# Patient Record
Sex: Female | Born: 1940 | Race: White | Hispanic: No | Smoking: Never smoker
Health system: Southern US, Community
[De-identification: ages and names within clinical notes are randomized; demographics above are authoritative.]

## PROBLEM LIST (undated history)

## (undated) ENCOUNTER — Ambulatory Visit: Payer: No Typology Code available for payment source

## (undated) DIAGNOSIS — I10 Essential (primary) hypertension: Secondary | ICD-10-CM

## (undated) DIAGNOSIS — R42 Dizziness and giddiness: Secondary | ICD-10-CM

## (undated) DIAGNOSIS — F32A Depression, unspecified: Secondary | ICD-10-CM

## (undated) DIAGNOSIS — M1990 Unspecified osteoarthritis, unspecified site: Secondary | ICD-10-CM

## (undated) DIAGNOSIS — I4891 Unspecified atrial fibrillation: Secondary | ICD-10-CM

## (undated) HISTORY — PX: ABDOMINAL HYSTERECTOMY: SHX81

---

## 2008-05-19 ENCOUNTER — Ambulatory Visit: Payer: Self-pay

## 2011-10-13 ENCOUNTER — Ambulatory Visit: Payer: Self-pay | Admitting: Internal Medicine

## 2020-05-15 ENCOUNTER — Other Ambulatory Visit: Payer: Self-pay | Admitting: Family

## 2020-05-15 DIAGNOSIS — R590 Localized enlarged lymph nodes: Secondary | ICD-10-CM

## 2020-05-28 ENCOUNTER — Other Ambulatory Visit: Payer: Self-pay

## 2020-05-28 ENCOUNTER — Ambulatory Visit
Admission: RE | Admit: 2020-05-28 | Discharge: 2020-05-28 | Disposition: A | Discharge status: Home / Self Care | Payer: No Typology Code available for payment source | Source: Ambulatory Visit | Attending: Family | Admitting: Family

## 2020-05-28 DIAGNOSIS — R590 Localized enlarged lymph nodes: Secondary | ICD-10-CM | POA: Diagnosis not present

## 2020-05-28 LAB — POCT I-STAT CREATININE: Creatinine, Ser: 0.6 mg/dL (ref 0.44–1.00)

## 2020-05-28 IMAGING — CT CT NECK W/ CM
3 of 4 series · 14 of 33 positions shown, 17 images · IV contrast (omnipaque)
Comparison: None.

CLINICAL DATA: Knot and swelling inferior to right ear

EXAM:
CT NECK WITH CONTRAST
TECHNIQUE: Multidetector CT imaging of the neck was performed using the
standard protocol following the bolus administration of intravenous
contrast.
CONTRAST:  75mL OMNIPAQUE IOHEXOL 300 MG/ML  SOLN

[Series 5: sag neck · sagittal · 0.41mm/px · 5 of 103 slices shown, 6 images]
[im 35/103  bone]
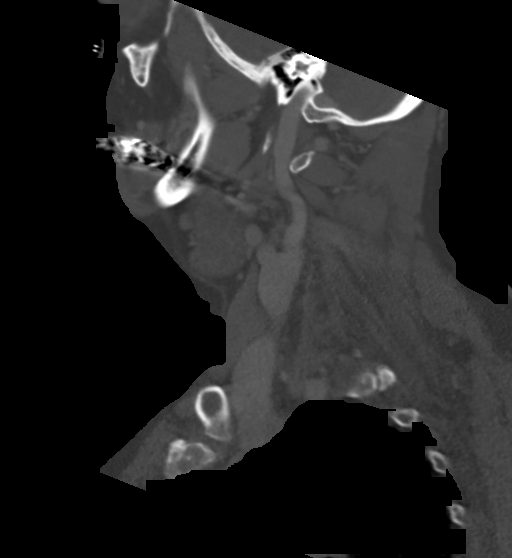
[im 43/103  bone]
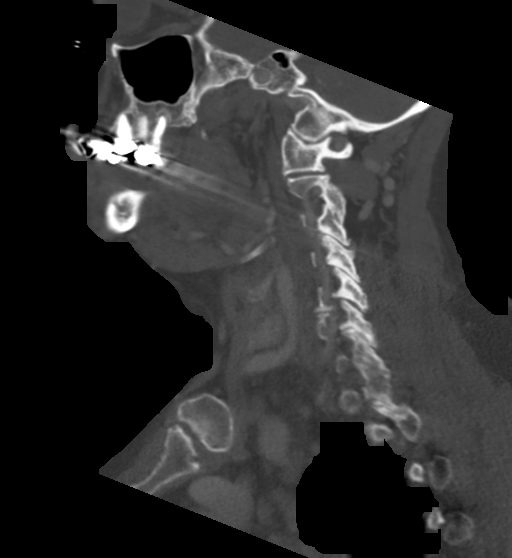
[im 52/103  soft-tissue]
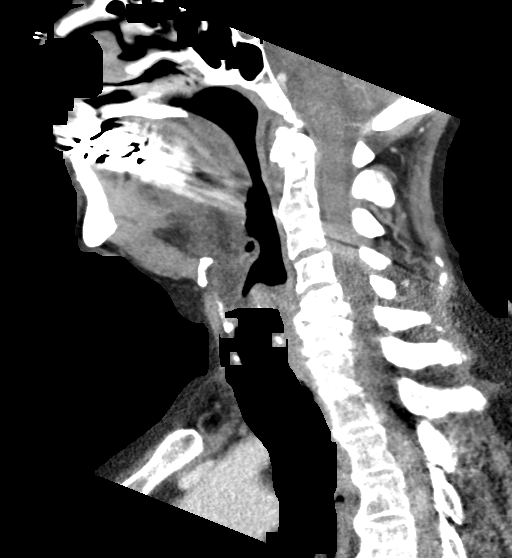
[im 52/103  bone]
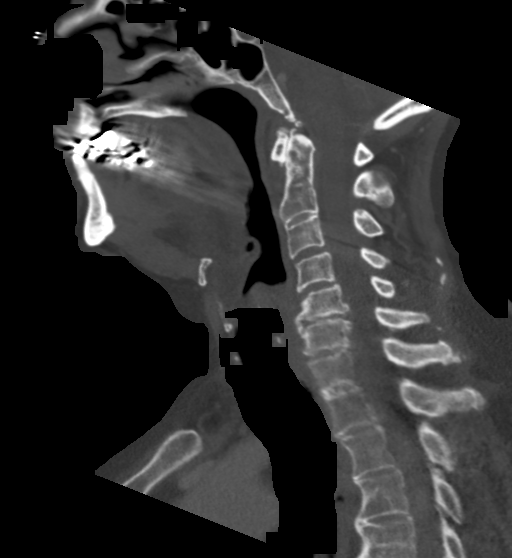
[im 60/103  bone]
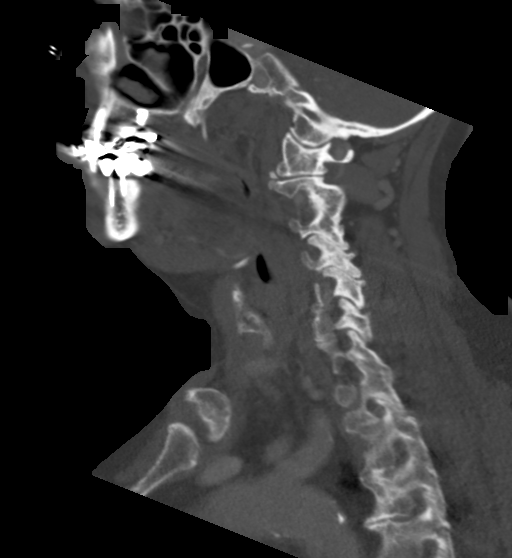
[im 69/103  bone]
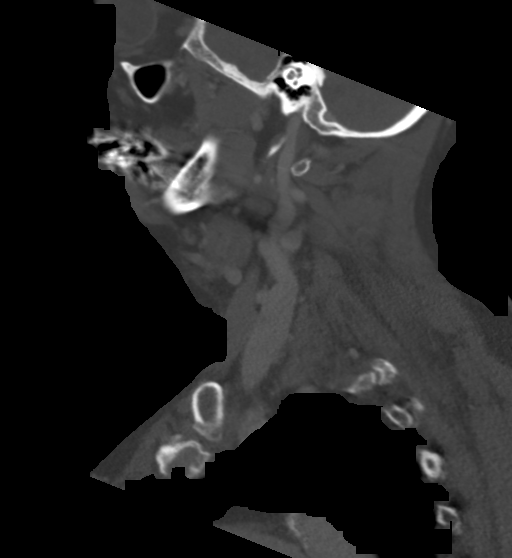

[Series 6: cor neck · coronal · 0.40mm/px · 3 of 106 slices shown]
[im 33/106  bone]
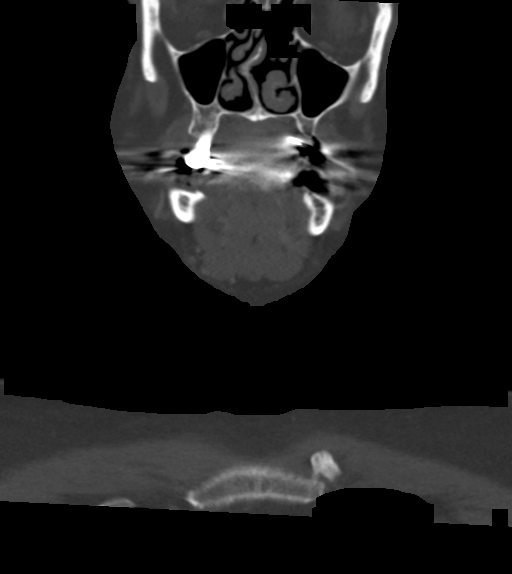
[im 46/106  bone]
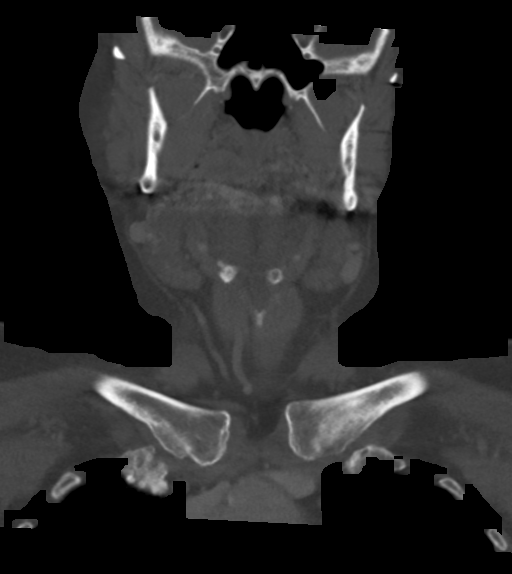
[im 60/106  bone]
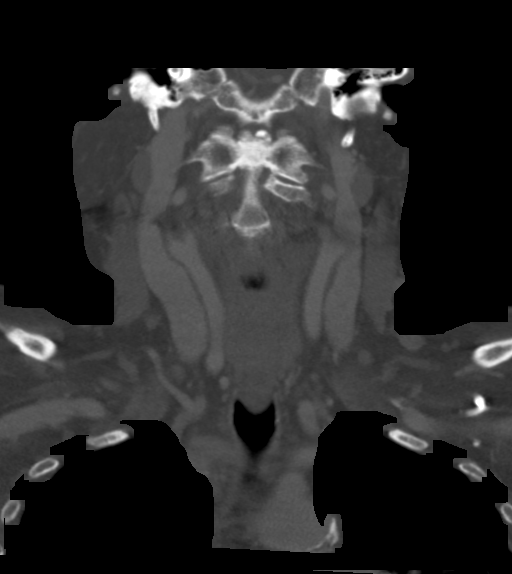

[Series 7: orthogonal ax · axial · 0.40mm/px · z∈[+1515,+1667]mm · 6 of 116 slices shown, 8 images]
[im 17/116  soft-tissue]
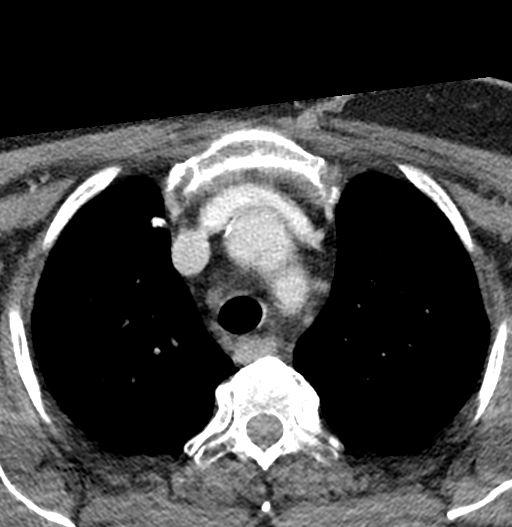
[im 17/116  bone]
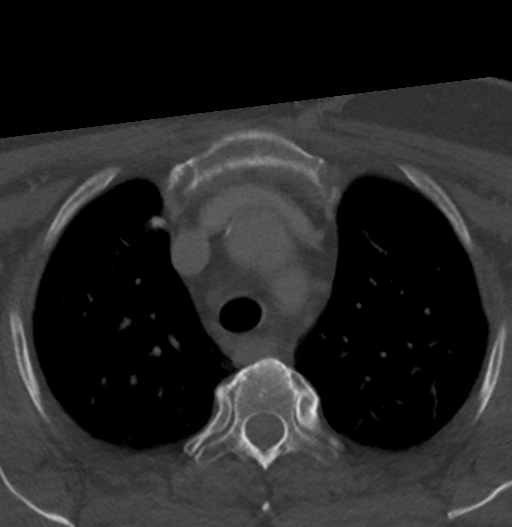
[im 33/116  bone]
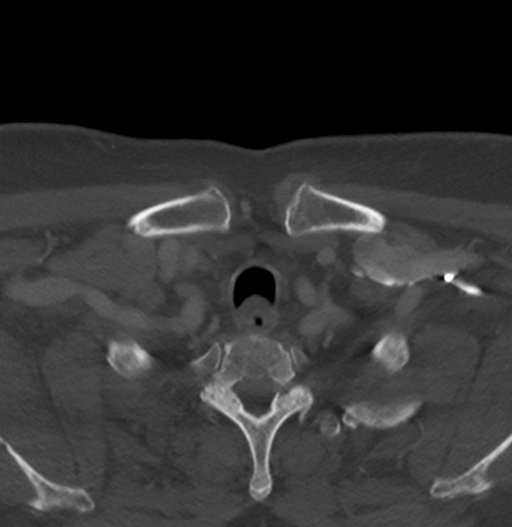
[im 50/116  bone]
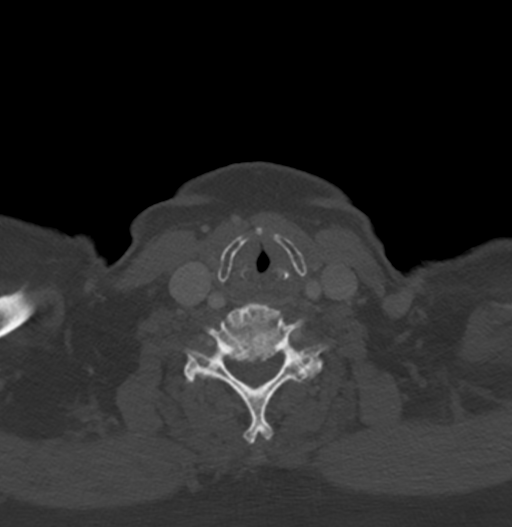
[im 66/116  bone]
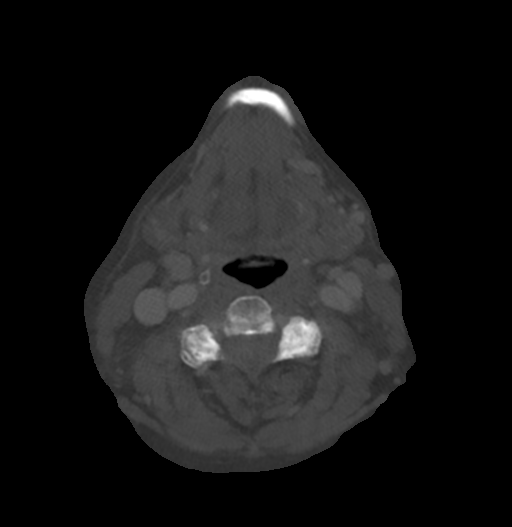
[im 83/116  soft-tissue]
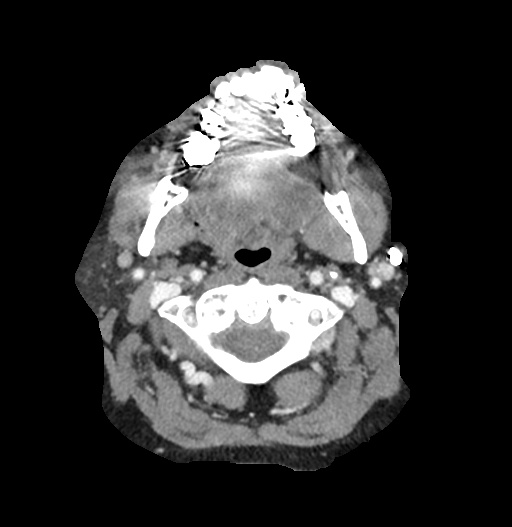
[im 83/116  bone]
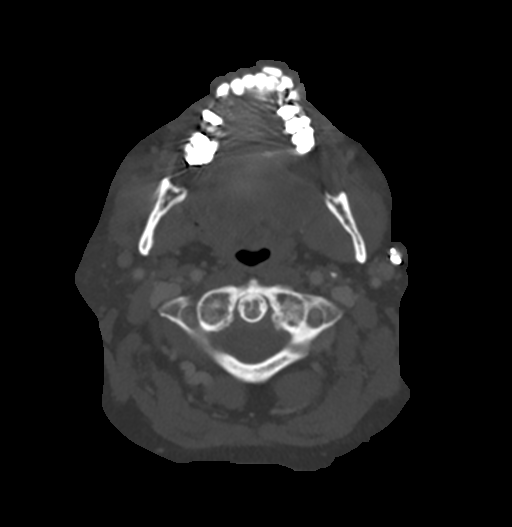
[im 99/116  bone]
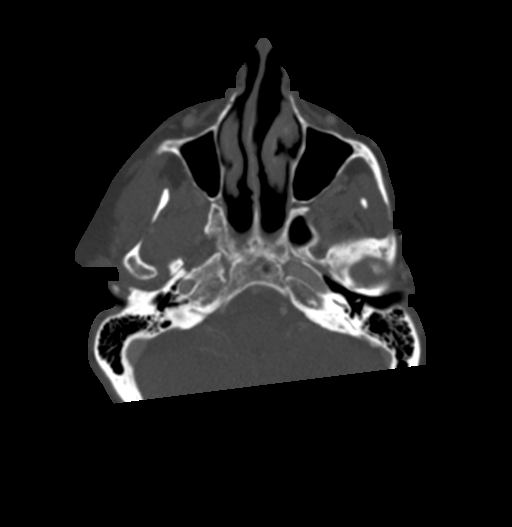

[14 of 33 positions shown; findings below may reference images not displayed]

FINDINGS: Pharynx and larynx: Normal. No mass or swelling.

Salivary glands: Left parotidectomy with surgical clips in the
region. No mass lesion.

Normal right parotid. Area of concern marked by vitamin-E capsule
lung the lower margin of the right parotid gland. No mass or
adenopathy in this region.

Submandibular gland normal bilaterally.

Thyroid: Negative.  Small thyroid volume.

Lymph nodes: No enlarged lymph nodes in the neck.

Vascular: Normal vascular enhancement.

Limited intracranial: Negative

Visualized orbits: Negative

Mastoids and visualized paranasal sinuses: Negative

Skeleton: Cervical spondylosis with multilevel disc and facet
degeneration. Spurring most prominent at C5-6 and C6-7.

Upper chest: Lung apices clear bilaterally.

Other: None
IMPRESSION: Negative for mass or adenopathy in neck

Postop left parotidectomy

Area of concern is located along the lower margin of the right
parotid gland. No mass in this region.

## 2020-05-28 MED ORDER — IOHEXOL 300 MG/ML  SOLN
75.0000 mL | Freq: Once | INTRAMUSCULAR | Status: AC | PRN
  Administered 2020-05-28: 75 mL via INTRAVENOUS

## 2021-05-07 ENCOUNTER — Other Ambulatory Visit: Payer: Self-pay

## 2021-05-07 ENCOUNTER — Ambulatory Visit
Admission: EM | Admit: 2021-05-07 | Discharge: 2021-05-07 | Disposition: A | Discharge status: Home / Self Care | Payer: No Typology Code available for payment source | Attending: Internal Medicine | Admitting: Internal Medicine

## 2021-05-07 DIAGNOSIS — N390 Urinary tract infection, site not specified: Secondary | ICD-10-CM | POA: Insufficient documentation

## 2021-05-07 LAB — URINALYSIS, ROUTINE W REFLEX MICROSCOPIC
Bilirubin Urine: NEGATIVE
Glucose, UA: NEGATIVE mg/dL
Ketones, ur: NEGATIVE mg/dL
Nitrite: POSITIVE — AB
Protein, ur: 30 mg/dL — AB
Specific Gravity, Urine: 1.025 (ref 1.005–1.030)
pH: 6 (ref 5.0–8.0)

## 2021-05-07 LAB — URINALYSIS, MICROSCOPIC (REFLEX): WBC, UA: >50 WBC/hpf (ref 0–5)

## 2021-05-07 MED ORDER — CEPHALEXIN 500 MG PO CAPS: 500.0000 mg | ORAL_CAPSULE | Freq: Two times a day (BID) | ORAL | 0 refills | Status: AC

## 2021-05-07 NOTE — ED Triage Notes (Signed)
Pt c/o possible UTI. Pt has had urinary pressure for "quite a while".

## 2021-05-07 NOTE — ED Provider Notes (Signed)
[facility]    CSN: [id number] Arrival date & time: 05/07/21  1126      History   Chief Complaint Chief Complaint  Patient presents with   Urinary Tract Infection    HPI Erica Edwards is a 81 y.o. female.  She presents today "a UTI," with urinary urgency and pressure, increased frequency frequency, including at nighttime.  She says has been going on for "a while", hard to pin down exactly how long.    No dysuria, no abdominal or pelvic pain.  No itching.  No fever.  No unusual vaginal discharge or bleeding, no change in bowel habits.  No nausea or vomiting.  Says she gets a urinary tract infection may be 2-3 times per year.  No change in chronic back pain which she has had for years.  HPI  History reviewed. No pertinent past medical history.  There are no problems to display for this patient.   History reviewed. No pertinent surgical history.   Home Medications    Prior to Admission medications   Medication Sig Start Date End Date Taking? Authorizing Provider  amLODipine (NORVASC) 10 MG tablet TAKE ONE-HALF TABLET BY MOUTH EVERY DAY FOR BLOOD PRESSURE 04/02/21  Yes [provider]  apixaban (ELIQUIS) 5 MG TABS tablet TAKE ONE TABLET BY MOUTH EVERY 12 HOURS - CAUTION BLOOD THINNER 05/31/20  Yes [provider]  cephALEXin (KEFLEX) 500 MG capsule Take 1 capsule (500 mg total) by mouth 2 (two) times daily for 5 days. 05/07/21 05/12/21 Yes Isa Rankin, MD  diclofenac Sodium (VOLTAREN) 1 % GEL APPLY 4 GRAMS TOPICALLY TWO TIMES A DAY AS NEEDED PUT ON KNEES FOR PAIN AS DIRECTED. USE GLOVES TO APPLY. 04/02/21  Yes [provider]  gabapentin (NEURONTIN) 300 MG capsule TAKE TWO CAPSULES BY MOUTH THREE TIMES A DAY FOR NERVE PAIN *MAY CAUSE DROWSINESS* 04/02/21  Yes [provider]  levothyroxine (SYNTHROID) 100 MCG tablet Take by mouth.   Yes [provider]  lisinopril (ZESTRIL) 40 MG tablet TAKE ONE-HALF TABLET BY MOUTH  EVERY DAY FOR BLOOD PRESSURE 04/02/21  Yes [provider]  trospium (SANCTURA) 20 MG tablet Take by mouth.   Yes [provider]    Family History History reviewed. No pertinent family history.  Social History Social History   Tobacco Use   Smoking status: Never   Smokeless tobacco: Never  Vaping Use   Vaping Use: Never used  Substance Use Topics   Alcohol use: Never   Drug use: Never     Allergies   Azithromycin, Gabapentin, Naproxen, Nortriptyline, Sertraline, Venlafaxine, Penicillin g, and Streptomycin   Review of Systems Review of Systems see HPI   Physical Exam Triage Vital Signs ED Triage Vitals  Enc Vitals Group     BP 05/07/21 1400 140/72     Pulse Rate 05/07/21 1400 85     Resp 05/07/21 1400 18     Temp 05/07/21 1400 97.9 F (36.6 C)     Temp Source 05/07/21 1400 Oral     SpO2 05/07/21 1400 98 %     Weight 05/07/21 1357 173 lb (78.5 kg)     Height 05/07/21 1357 4\' 11"  (1.499 m)     Pain Score 05/07/21 1356 0     Pain Loc --    Updated Vital Signs BP 140/72 (BP Location: Left Arm)    Pulse 85    Temp 97.9 F (36.6 C) (Oral)    Resp 18  Ht 4\' 11"  (1.499 m)    Wt 78.5 kg    LMP  (LMP Unknown)    SpO2 98%    BMI 34.94 kg/m   Physical Exam Constitutional:      General: She is not in acute distress.    Appearance: She is not ill-appearing or toxic-appearing.     Comments: Good hygiene Somewhat hard of hearing  HENT:     Head: Atraumatic.     Mouth/Throat:     Mouth: Mucous membranes are moist.  Eyes:     Conjunctiva/sclera:     Right eye: Right conjunctiva is not injected. No exudate.    Left eye: Left conjunctiva is not injected. No exudate.    Comments: Conjugate gaze observed  Cardiovascular:     Rate and Rhythm: Normal rate.  Pulmonary:     Effort: Pulmonary effort is normal. No respiratory distress.  Abdominal:     General: There is no distension.  Musculoskeletal:     Cervical back: Neck supple.     Comments: Walked  into the urgent care independently, does have a walking stick  Skin:    General: Skin is warm and dry.     Comments: Pink, no cyanosis  Neurological:     Mental Status: She is alert.     Comments: Face is symmetric, speech is clear, coherent, logical     UC Treatments / Results  Labs (all labs ordered are listed, but only abnormal results are displayed) Labs Reviewed  URINALYSIS, ROUTINE W REFLEX MICROSCOPIC - Abnormal; Notable for the following components:      Result Value   APPearance CLOUDY (*)    Hgb urine dipstick TRACE (*)    Protein, ur 30 (*)    Nitrite POSITIVE (*)    Leukocytes,Ua SMALL (*)    All other components within normal limits  URINALYSIS, MICROSCOPIC (REFLEX) - Abnormal; Notable for the following components:   Bacteria, UA MANY (*)    All other components within normal limits  URINE CULTURE  UA notable for >50 WBC, 6-10 RBC, many bacteria, 0-5 squames Culture pending  EKG N/A  Radiology No results found. N/A  Procedures Procedures (including critical care time) N/A  Medications Ordered in UC Medications - No data to display N/A   Final Clinical Impressions(s) / UC Diagnoses   Final diagnoses:  Lower urinary tract infectious disease     Discharge Instructions      Symptoms and urinalysis today are consistent with a urinary tract infection.  Prescription for cephalexin (antibiotic) sent to the pharmacy.  A urine culture is pending; the urgent care will contact you if a change in treatment is needed.  Push fluids.  Recheck for new fever >100.5, persistent vomiting, severe/persistent abdominal or pelvic pain, or if symptoms are not starting to improve in a few days as expected.     ED Prescriptions     Medication Sig Dispense Auth. Provider   cephALEXin (KEFLEX) 500 MG capsule Take 1 capsule (500 mg total) by mouth 2 (two) times daily for 5 days. 10 capsule Wynona Luna, MD      PDMP not reviewed this encounter.   Wynona Luna, MD 05/08/21 [phone]

## 2021-05-07 NOTE — Discharge Instructions (Signed)
Symptoms and urinalysis today are consistent with a urinary tract infection.  Prescription for cephalexin (antibiotic) sent to the pharmacy.  A urine culture is pending; the urgent care will contact you if a change in treatment is needed.  Push fluids.  Recheck for new fever >100.5, persistent vomiting, severe/persistent abdominal or pelvic pain, or if symptoms are not starting to improve in a few days as expected.

## 2021-05-10 LAB — URINE CULTURE: Culture: >=100000 — AB

## 2021-05-13 ENCOUNTER — Other Ambulatory Visit: Payer: Self-pay

## 2021-05-13 ENCOUNTER — Ambulatory Visit
Admission: EM | Admit: 2021-05-13 | Discharge: 2021-05-13 | Disposition: A | Discharge status: Home / Self Care | Payer: No Typology Code available for payment source | Attending: Internal Medicine | Admitting: Internal Medicine

## 2021-05-13 ENCOUNTER — Telehealth: Payer: Self-pay | Admitting: Internal Medicine

## 2021-05-13 DIAGNOSIS — E079 Disorder of thyroid, unspecified: Secondary | ICD-10-CM

## 2021-05-13 DIAGNOSIS — Z79899 Other long term (current) drug therapy: Secondary | ICD-10-CM | POA: Diagnosis not present

## 2021-05-13 DIAGNOSIS — Z719 Counseling, unspecified: Secondary | ICD-10-CM | POA: Diagnosis not present

## 2021-05-13 HISTORY — DX: Unspecified osteoarthritis, unspecified site: M19.90

## 2021-05-13 HISTORY — DX: Depression, unspecified: F32.A

## 2021-05-13 HISTORY — DX: Dizziness and giddiness: R42

## 2021-05-13 HISTORY — DX: Unspecified atrial fibrillation: I48.91

## 2021-05-13 LAB — TSH: TSH: 10.911 u[IU]/mL — ABNORMAL HIGH (ref 0.350–4.500)

## 2021-05-13 MED ORDER — LEVOTHYROXINE SODIUM 112 MCG PO TABS: 112.0000 ug | ORAL_TABLET | Freq: Every day | ORAL | 0 refills | Status: DC

## 2021-05-13 NOTE — ED Triage Notes (Signed)
Patient is here for "concern with medication due to article in newspaper" (Synthroid). No other concerns.

## 2021-05-13 NOTE — Telephone Encounter (Signed)
Medication has been sent to the pharmacy on file

## 2021-05-13 NOTE — ED Provider Notes (Signed)
[facility]    CSN: [id number] Arrival date & time: 05/13/21  0849      History   Chief Complaint Chief Complaint  Patient presents with   Medication Problem    HPI Erica Edwards is a 81 y.o. female comes to the urgent care with questions about her medication.  Patient is currently on Synthroid and she read in newspaper that Tirosint was being recalled.  Patient came here to verify that her prescription is not part of the medications being recalled.  She has taken Synthroid for several years.  No cold intolerance, weight gain, constipation or fatigue.  HPI  Past Medical History:  Diagnosis Date   Atrial fibrillation (HCC)    Depression    Osteoarthritis    Vertigo     There are no problems to display for this patient.   History reviewed. No pertinent surgical history.  OB History   No obstetric history on file.      Home Medications    Prior to Admission medications   Medication Sig Start Date End Date Taking? Authorizing Provider  amLODipine (NORVASC) 10 MG tablet TAKE ONE-HALF TABLET BY MOUTH EVERY DAY FOR BLOOD PRESSURE 04/02/21  Yes [provider]  apixaban (ELIQUIS) 5 MG TABS tablet TAKE ONE TABLET BY MOUTH EVERY 12 HOURS - CAUTION BLOOD THINNER 05/31/20  Yes [provider]  diclofenac Sodium (VOLTAREN) 1 % GEL APPLY 4 GRAMS TOPICALLY TWO TIMES A DAY AS NEEDED PUT ON KNEES FOR PAIN AS DIRECTED. USE GLOVES TO APPLY. 04/02/21  Yes [provider]  gabapentin (NEURONTIN) 300 MG capsule TAKE TWO CAPSULES BY MOUTH THREE TIMES A DAY FOR NERVE PAIN *MAY CAUSE DROWSINESS* 04/02/21  Yes [provider]  levothyroxine (SYNTHROID) 100 MCG tablet Take by mouth.   Yes [provider]  lisinopril (ZESTRIL) 40 MG tablet TAKE ONE-HALF TABLET BY MOUTH EVERY DAY FOR BLOOD PRESSURE 04/02/21  Yes [provider]  trospium (SANCTURA) 20 MG tablet Take by mouth.   Yes [provider]    Family History No  family history on file.  Social History Social History   Tobacco Use   Smoking status: Never   Smokeless tobacco: Never  Vaping Use   Vaping Use: Never used  Substance Use Topics   Alcohol use: Never   Drug use: Never     Allergies   Azithromycin, Gabapentin, Naproxen, Nortriptyline, Sertraline, Venlafaxine, Penicillin g, and Streptomycin   Review of Systems Review of Systems  All other systems reviewed and are negative.   Physical Exam Triage Vital Signs ED Triage Vitals [05/13/21 0940]  Enc Vitals Group     BP (!) 156/78     Pulse Rate 87     Resp 20     Temp 97.8 F (36.6 C)     Temp Source Oral     SpO2 97 %     Weight 173 lb 1 oz (78.5 kg)     Height      Head Circumference      Peak Flow      Pain Score 0     Pain Loc      Pain Edu?      Excl. in [place]?    No data found.  Updated Vital Signs BP (!) 156/78 (BP Location: Left Arm)    Pulse 87    Temp 97.8 F (36.6 C) (Oral)    Resp 20    Wt 78.5 kg    LMP  (  LMP Unknown)    SpO2 97%    BMI 34.95 kg/m   Visual Acuity Right Eye Distance:   Left Eye Distance:   Bilateral Distance:    Right Eye Near:   Left Eye Near:    Bilateral Near:     Physical Exam Vitals and nursing note reviewed.  Constitutional:      Appearance: Normal appearance.  Cardiovascular:     Rate and Rhythm: Normal rate and regular rhythm.     Pulses: Normal pulses.     Heart sounds: Normal heart sounds.  Pulmonary:     Effort: Pulmonary effort is normal.     Breath sounds: Normal breath sounds.  Musculoskeletal:        General: Normal range of motion.  Neurological:     Mental Status: She is alert.     UC Treatments / Results  Labs (all labs ordered are listed, but only abnormal results are displayed) Labs Reviewed  TSH    EKG   Radiology No results found.  Procedures Procedures (including critical care time)  Medications Ordered in UC Medications - No data to display  Initial Impression / Assessment and  Plan / UC Course  I have reviewed the triage vital signs and the nursing notes.  Pertinent labs & imaging results that were available during my care of the patient were reviewed by me and considered in my medical decision making (see chart for details).     1.  Medication therapy education: TSH Patient was reassured that her medication is not Tirosint. If labs are abnormal we will call patient with recommendations. Final Clinical Impressions(s) / UC Diagnoses   Final diagnoses:  Medication therapy continued     Discharge Instructions      Medication is not the brand that is being recalled. Continue taking the medication We will call you with recommendation if labs are abnormal   ED Prescriptions   None    PDMP not reviewed this encounter.   Merrilee Jansky, MD 05/14/21 1640

## 2021-05-13 NOTE — Discharge Instructions (Signed)
Medication is not the brand that is being recalled. Continue taking the medication We will call you with recommendation if labs are abnormal

## 2021-05-13 NOTE — ED Triage Notes (Signed)
See problem list for Full Medical History.

## 2021-07-16 ENCOUNTER — Ambulatory Visit
Admission: EM | Admit: 2021-07-16 | Discharge: 2021-07-16 | Disposition: A | Discharge status: Home / Self Care | Payer: No Typology Code available for payment source | Attending: Physician Assistant | Admitting: Physician Assistant

## 2021-07-16 DIAGNOSIS — M5136 Other intervertebral disc degeneration, lumbar region: Secondary | ICD-10-CM | POA: Diagnosis not present

## 2021-07-16 DIAGNOSIS — M5442 Lumbago with sciatica, left side: Secondary | ICD-10-CM

## 2021-07-16 DIAGNOSIS — G8929 Other chronic pain: Secondary | ICD-10-CM | POA: Diagnosis not present

## 2021-07-16 DIAGNOSIS — M1612 Unilateral primary osteoarthritis, left hip: Secondary | ICD-10-CM | POA: Diagnosis not present

## 2021-07-16 MED ORDER — TRAMADOL HCL 50 MG PO TABS
50.0000 mg | ORAL_TABLET | Freq: Three times a day (TID) | ORAL | 0 refills | Status: AC | PRN
Start: 2021-07-16 — End: 2021-07-21

## 2021-07-16 NOTE — ED Provider Notes (Signed)
?[facility] ? ? ? ?CSN: [id number] ?Arrival date & time: 07/16/21  [id number] ? ? ?  ? ?History   ?Chief Complaint ?Chief Complaint  ?Patient presents with  ? Hip Pain  ? Back Pain  ? Labs Only  ? ? ?HPI ?Erica Edwards is a 81 y.o. female with history of atrial fibrillation (anticoagulated on Eliquis), anxiety, BPPV, chronic back pain, degenerative disc disease of lumbar spine, depression, hypertension, hyperlipidemia, diastolic heart dysfunction, hypothyroidism, GERD, IBS and osteoarthritis of knees and hips. ? ?Patient presents today for evaluation of chronic back and hip pain.  Patient says she has had pain for several years.  Patient is presently on gabapentin 600 mg 3 times daily for this.  She follows up with the [facility].  Patient also sees a Restaurant manager, fast food.  Patient has MRI report from 2018 which says she has degenerative disc disease, facet arthropathy.  Patient reports her chiropractor told her her tailbone was "too far backwards" and she had slipped disks.  She had x-ray performed recently which indicated only mild to moderate degenerative disc disease, spurring, and arthropathy.  Patient says her back and hip pain have been worse recently.  She feels radiation of the pain from the low back down the left hip all the way to her knee.  Pain is worse with sitting for long time or changing positions.  Increased pain when she lifts her leg.  No loss of bowel or bladder control or leg weakness or falls recently. ? ?She saw PCP last month and had lab work ordered.  She says she does not want to go "all the way out there" to have the labs done and asks to have them done here. ? ?HPI ? ?Past Medical History:  ?Diagnosis Date  ? Atrial fibrillation ([facility])   ? Depression   ? Osteoarthritis   ? Vertigo   ? ? ?There are no problems to display for this patient. ? ? ?History reviewed. No pertinent surgical history. ? ?OB History   ?No obstetric history on file. ?  ? ? ? ?Home Medications   ? ?Prior to  Admission medications   ?Medication Sig Start Date End Date Taking? Authorizing Provider  ?traMADol (ULTRAM) 50 MG tablet Take 1 tablet (50 mg total) by mouth every 8 (eight) hours as needed for up to 5 days. 07/16/21 07/21/21 Yes Laurene Footman B, PA-C  ?amLODipine (NORVASC) 10 MG tablet TAKE ONE-HALF TABLET BY MOUTH EVERY DAY FOR BLOOD PRESSURE 04/02/21   [provider]  ?apixaban (ELIQUIS) 5 MG TABS tablet TAKE ONE TABLET BY MOUTH EVERY 12 HOURS - CAUTION BLOOD THINNER 05/31/20   [provider]  ?diclofenac Sodium (VOLTAREN) 1 % GEL APPLY 4 GRAMS TOPICALLY TWO TIMES A DAY AS NEEDED PUT ON KNEES FOR PAIN AS DIRECTED. USE GLOVES TO APPLY. 04/02/21   [provider]  ?gabapentin (NEURONTIN) 300 MG capsule TAKE TWO CAPSULES BY MOUTH THREE TIMES A DAY FOR NERVE PAIN *MAY CAUSE DROWSINESS* 04/02/21   [provider]  ?levothyroxine (SYNTHROID) 112 MCG tablet Take 1 tablet (112 mcg total) by mouth daily before breakfast. 05/13/21 06/12/21  Lamptey, Myrene Galas, MD  ?lisinopril (ZESTRIL) 40 MG tablet TAKE ONE-HALF TABLET BY MOUTH EVERY DAY FOR BLOOD PRESSURE 04/02/21   [provider]  ?trospium (SANCTURA) 20 MG tablet Take by mouth.    [provider]  ? ? ?Family History ?History reviewed. No pertinent family history. ? ?Social History ?Social History  ? ?Tobacco Use  ?  Smoking status: Never  ? Smokeless tobacco: Never  ?Vaping Use  ? Vaping Use: Never used  ?Substance Use Topics  ? Alcohol use: Never  ? Drug use: Never  ? ? ? ?Allergies   ?Azithromycin, Gabapentin, Naproxen, Nortriptyline, Sertraline, Venlafaxine, Penicillin g, and Streptomycin ? ? ?Review of Systems ?Review of Systems  ?Genitourinary:  Negative for dysuria, flank pain and frequency.  ?Musculoskeletal:  Positive for arthralgias, back pain and gait problem (uses cane). Negative for joint swelling.  ?Neurological:  Negative for weakness and numbness.  ? ? ?Physical Exam ?Triage Vital Signs ?ED Triage Vitals   ?Enc Vitals Group  ?   BP 07/16/21 0925 125/61  ?   Pulse Rate 07/16/21 0925 71  ?   Resp 07/16/21 0925 16  ?   Temp 07/16/21 0925 98.3 ?F (36.8 ?C)  ?   Temp Source 07/16/21 0925 Oral  ?   SpO2 07/16/21 0925 95 %  ?   Weight --   ?   Height --   ?   Head Circumference --   ?   Peak Flow --   ?   Pain Score 07/16/21 0922 7  ?   Pain Loc --   ?   Pain Edu? --   ?   Excl. in [place]? --   ? ?No data found. ? ?Updated Vital Signs ?BP 125/61 (BP Location: Left Arm)   Pulse 71   Temp 98.3 ?F (36.8 ?C) (Oral)   Resp 16   LMP  (LMP Unknown)   SpO2 95%  ?   ? ?Physical Exam ?Vitals and nursing note reviewed.  ?Constitutional:   ?   General: She is not in acute distress. ?   Appearance: Normal appearance. She is not ill-appearing or toxic-appearing.  ?HENT:  ?   Head: Normocephalic and atraumatic.  ?Eyes:  ?   General: No scleral icterus.    ?   Right eye: No discharge.     ?   Left eye: No discharge.  ?   Conjunctiva/sclera: Conjunctivae normal.  ?Cardiovascular:  ?   Rate and Rhythm: Normal rate and regular rhythm.  ?   Heart sounds: Normal heart sounds.  ?Pulmonary:  ?   Effort: Pulmonary effort is normal. No respiratory distress.  ?   Breath sounds: Normal breath sounds.  ?Musculoskeletal:  ?   Cervical back: Neck supple.  ?   Lumbar back: Tenderness (Left lumbar paravertebral muscles. L4-L5, L5-S1) present. Decreased range of motion. Positive left straight leg raise test. Negative right straight leg raise test.  ?Skin: ?   General: Skin is dry.  ?Neurological:  ?   General: No focal deficit present.  ?   Mental Status: She is alert. Mental status is at baseline.  ?   Motor: No weakness.  ?   Coordination: Coordination normal.  ?   Gait: Gait abnormal (uses cane).  ?Psychiatric:     ?   Mood and Affect: Mood normal.     ?   Behavior: Behavior normal.     ?   Thought Content: Thought content normal.  ? ? ? ?UC Treatments / Results  ?Labs ?(all labs ordered are listed, but only abnormal results are displayed) ?Labs  Reviewed - No data to display ? ?EKG ? ? ?Radiology ?No results found. ? ?Procedures ?Procedures (including critical care time) ? ?Medications Ordered in UC ?Medications - No data to display ? ?Initial Impression / Assessment and Plan / UC Course  ?I have reviewed  the triage vital signs and the nursing notes. ? ?Pertinent labs & imaging results that were available during my care of the patient were reviewed by me and considered in my medical decision making (see chart for details). ? ?81 year old female presenting for chronic back and hip pain which have worsened over the past several days.  Pain radiates from the left low back to the left knee.  Patient is presently taking gabapentin 600 mg 3 times daily but reports she has been increasing it sometimes to 900 mg 2-3 times a day.  She says when she does this it makes her a little dizzy.  She says she has been desperate because her pain has been worse.  She is taking Eliquis and so avoids NSAIDs.  Patient also believes she cannot take Tylenol due to the Eliquis.  She has not tried that.  I did review patient's imaging from 2018 and recent x-rays.  She does have degenerative disc disease and facet arthropathy as well as suspected pinched nerve.  Patient sees chiropractor and PCP.  Exam today is consistent with low low back pain and sciatica.  No red flag signs or symptoms.  Discussed with patient taking the gabapentin as prescribed.  Advised trying over-the-counter Tylenol which should be fine with her Eliquis.  She just needs to avoid NSAIDs.  Prescribed a few Ultram if absolutely needed after reviewing controlled substance database advised her it is a low-dose narcotic so she may not be prescribed this again and could be referred to pain clinic but she should talk to her PCP about that.  Also advised asking PCP for referral to orthopedics where she can discussed eventually getting corticosteroid injections into the L-spine and/or hip joint.  Also discussed  importance of physical therapy which can help her improve her mobility.  Advised patient we do not perform routine lab work I see the labs were already ordered the [place] center.  Advised her to reach out to PCP and have the lab wo

## 2021-07-16 NOTE — ED Triage Notes (Signed)
Patient presents to Urgent Care with complaints of chronic back pain, hip pain 5-6 years. Treating pain with gabapentin.  Requesting blood work.  ?

## 2021-07-16 NOTE — Discharge Instructions (Signed)
-  Continue gabapentin 600 mg 3 times a day as instructed by your provider.  Do not increase medication without speaking to them first. ?- Start taking Tylenol up to 1 g or 1000 mg of Tylenol 3 times a day.  If that is not strong enough, try the tramadol.  This is a mild narcotic so I would expect you would not be prescribed this medication long-term unless you were to join a pain clinic.  You can speak to your provider about this if nothing else is helping ?- I advise you ask for referral to orthopedics.  You may benefit from having corticosteroid injections into your back or hip. ?- Also discussed with your provider about physical therapy. ?- Your provider has ordered the labs for you to have done at the [place].  If it is difficult for you to get out there, you can asked to have the lab sent to [place] or someone else closer to you. ?

## 2021-09-25 ENCOUNTER — Encounter: Payer: Self-pay | Admitting: Emergency Medicine

## 2021-09-25 ENCOUNTER — Other Ambulatory Visit: Payer: Self-pay

## 2021-09-25 ENCOUNTER — Ambulatory Visit
Admission: EM | Admit: 2021-09-25 | Discharge: 2021-09-25 | Disposition: A | Discharge status: Home / Self Care | Payer: No Typology Code available for payment source | Attending: Emergency Medicine | Admitting: Emergency Medicine

## 2021-09-25 DIAGNOSIS — K047 Periapical abscess without sinus: Secondary | ICD-10-CM | POA: Diagnosis not present

## 2021-09-25 MED ORDER — DOXYCYCLINE HYCLATE 100 MG PO CAPS: 100.0000 mg | ORAL_CAPSULE | Freq: Two times a day (BID) | ORAL | 0 refills | Status: DC

## 2021-09-25 NOTE — ED Provider Notes (Signed)
[facility]    CSN: [id number] Arrival date & time: 09/25/21  0802      History   Chief Complaint Chief Complaint  Patient presents with   Dental Pain    HPI Erica Edwards is a 81 y.o. female.   HPI  81 year old female here for evaluation of dental complaint.  Patient reports that she has been experiencing pain and swelling to the right side of her face and in her upper rear molar for the last 5 days.  She states that on the first day she had a subjective fever that last about 5 hours but she did not take her temperature.  She denies any drainage from the tooth or fever.  She has not contacted her dentist though she does have 1.  Past Medical History:  Diagnosis Date   Atrial fibrillation (HCC)    Depression    Osteoarthritis    Vertigo     There are no problems to display for this patient.   History reviewed. No pertinent surgical history.  OB History   No obstetric history on file.      Home Medications    Prior to Admission medications   Medication Sig Start Date End Date Taking? Authorizing Provider  amLODipine (NORVASC) 10 MG tablet TAKE ONE-HALF TABLET BY MOUTH EVERY DAY FOR BLOOD PRESSURE 04/02/21  Yes [provider]  apixaban (ELIQUIS) 5 MG TABS tablet TAKE ONE TABLET BY MOUTH EVERY 12 HOURS - CAUTION BLOOD THINNER 05/31/20  Yes [provider]  doxycycline (VIBRAMYCIN) 100 MG capsule Take 1 capsule (100 mg total) by mouth 2 (two) times daily. 09/25/21  Yes Becky Augusta, NP  gabapentin (NEURONTIN) 300 MG capsule TAKE TWO CAPSULES BY MOUTH THREE TIMES A DAY FOR NERVE PAIN *MAY CAUSE DROWSINESS* 04/02/21  Yes [provider]  levothyroxine (SYNTHROID) 112 MCG tablet Take 1 tablet (112 mcg total) by mouth daily before breakfast. 05/13/21 09/25/21 Yes Lamptey, Britta Mccreedy, MD  lisinopril (ZESTRIL) 40 MG tablet TAKE ONE-HALF TABLET BY MOUTH EVERY DAY FOR BLOOD PRESSURE 04/02/21  Yes [provider]  trospium (SANCTURA) 20  MG tablet Take by mouth.   Yes [provider]  diclofenac Sodium (VOLTAREN) 1 % GEL APPLY 4 GRAMS TOPICALLY TWO TIMES A DAY AS NEEDED PUT ON KNEES FOR PAIN AS DIRECTED. USE GLOVES TO APPLY. 04/02/21   [provider]    Family History History reviewed. No pertinent family history.  Social History Social History   Tobacco Use   Smoking status: Never   Smokeless tobacco: Never  Vaping Use   Vaping Use: Never used  Substance Use Topics   Alcohol use: Never   Drug use: Never     Allergies   Azithromycin, Gabapentin, Naproxen, Nortriptyline, Sertraline, Venlafaxine, Penicillin g, and Streptomycin   Review of Systems Review of Systems  Constitutional:  Negative for fever.  HENT:  Positive for dental problem and facial swelling. Negative for trouble swallowing.      Physical Exam Triage Vital Signs ED Triage Vitals  Enc Vitals Group     BP --      Pulse --      Resp --      Temp --      Temp src --      SpO2 --      Weight 09/25/21 0814 173 lb 1 oz (78.5 kg)     Height 09/25/21 0814 4\' 11"  (1.499 m)     Head Circumference --  Peak Flow --      Pain Score 09/25/21 0813 0     Pain Loc --      Pain Edu? --      Excl. in GC? --    No data found.  Updated Vital Signs BP 112/67 (BP Location: Right Arm)   Pulse 72   Temp 98.6 F (37 C) (Oral)   Resp 16   Ht 4\' 11"  (1.499 m)   Wt 173 lb 1 oz (78.5 kg)   LMP  (LMP Unknown)   SpO2 96%   BMI 34.95 kg/m   Visual Acuity Right Eye Distance:   Left Eye Distance:   Bilateral Distance:    Right Eye Near:   Left Eye Near:    Bilateral Near:     Physical Exam Vitals and nursing note reviewed.  Constitutional:      Appearance: Normal appearance. She is not ill-appearing.  HENT:     Head: Normocephalic and atraumatic.     Mouth/Throat:     Mouth: Mucous membranes are moist.     Pharynx: Oropharynx is clear. No oropharyngeal exudate or posterior oropharyngeal erythema.  Musculoskeletal:      Cervical back: Normal range of motion and neck supple.  Lymphadenopathy:     Cervical: No cervical adenopathy.  Skin:    General: Skin is warm and dry.     Capillary Refill: Capillary refill takes less than 2 seconds.     Findings: No erythema.  Neurological:     General: No focal deficit present.     Mental Status: She is alert and oriented to person, place, and time.  Psychiatric:        Mood and Affect: Mood normal.        Behavior: Behavior normal.        Thought Content: Thought content normal.        Judgment: Judgment normal.      UC Treatments / Results  Labs (all labs ordered are listed, but only abnormal results are displayed) Labs Reviewed - No data to display  EKG   Radiology No results found.  Procedures Procedures (including critical care time)  Medications Ordered in UC Medications - No data to display  Initial Impression / Assessment and Plan / UC Course  I have reviewed the triage vital signs and the nursing notes.  Pertinent labs & imaging results that were available during my care of the patient were reviewed by me and considered in my medical decision making (see chart for details).  Patient is a pleasant, nontoxic-appearing 81 year old female here for evaluation of pain and swelling to the right side of her face and in her right upper premolar that started approximately 5 days ago.  She does have a partial that is soaked onto this tooth.  She denies any pain with hot or cold but there is pain with pressure.  She has not noticed any drainage from the tooth.  She did have a subjective fever for a few hours but reports that that is resolved.  On examination patient does have mild swelling and tenderness to the right side of her face along her upper gumline.  Oropharyngeal exam reveals a premolar that has a large partial filling.  The surrounding gum tissue is not erythematous or edematous.  No discharge noted.  The tooth is tender to percussion.  Patient  significant past medical history for atrial fibrillation, depression, osteoarthritis, and vertigo.  She is on Eliquis.  I will place her on  doxycycline twice daily for 10 days for treatment of her dental infection as she is allergic to penicillin and azithromycin.  She does have a dentist and I have encouraged her to contact her dentist to arrange for an appointment as the feeling may need to be redone.  I have also directed her to go to the emergency department at [facility] if her symptoms worsen since they have a dentist on-call.   Final Clinical Impressions(s) / UC Diagnoses   Final diagnoses:  Dental infection     Discharge Instructions      Take the Doxycycline twice daily with food for 10 days for treatment of your dental infection.  Use over-the-counter Tylenol for pain.  Rinse with warm salt water, or Listerine, after each meal to remove food particles and wash away any pus that is collecting.  Call your dentist today to get an appointment.  If you develop any increasing or swelling, fever, pain, or difficulty swallowing you to go to the emergency department at [facility] with a have an oral surgeon and also a dentist on-call.      ED Prescriptions     Medication Sig Dispense Auth. Provider   doxycycline (VIBRAMYCIN) 100 MG capsule Take 1 capsule (100 mg total) by mouth 2 (two) times daily. 20 capsule Becky Augusta, NP      PDMP not reviewed this encounter.   Becky Augusta, NP 09/25/21 0830

## 2021-09-25 NOTE — ED Triage Notes (Signed)
Pt c/o lower, right sided tooth pain and jaw swelling. Started about 2 days ago.

## 2021-09-25 NOTE — Discharge Instructions (Signed)
Take the Doxycycline twice daily with food for 10 days for treatment of your dental infection.  Use over-the-counter Tylenol for pain.  Rinse with warm salt water, or Listerine, after each meal to remove food particles and wash away any pus that is collecting.  Call your dentist today to get an appointment.  If you develop any increasing or swelling, fever, pain, or difficulty swallowing you to go to the emergency department at [facility] with a have an oral surgeon and also a dentist on-call.

## 2021-10-04 ENCOUNTER — Encounter: Payer: Self-pay | Admitting: Emergency Medicine

## 2021-10-04 ENCOUNTER — Ambulatory Visit
Admission: EM | Admit: 2021-10-04 | Discharge: 2021-10-04 | Disposition: A | Discharge status: Home / Self Care | Payer: No Typology Code available for payment source | Attending: Emergency Medicine | Admitting: Emergency Medicine

## 2021-10-04 DIAGNOSIS — U071 COVID-19: Secondary | ICD-10-CM | POA: Diagnosis present

## 2021-10-04 LAB — SARS CORONAVIRUS 2 BY RT PCR: SARS Coronavirus 2 by RT PCR: POSITIVE — AB

## 2021-10-04 MED ORDER — MOLNUPIRAVIR EUA 200MG CAPSULE: 4.0000 | ORAL_CAPSULE | Freq: Two times a day (BID) | ORAL | 0 refills | Status: AC

## 2021-10-04 MED ORDER — IPRATROPIUM BROMIDE 0.06 % NA SOLN: 2.0000 | Freq: Four times a day (QID) | NASAL | 12 refills | Status: DC

## 2021-10-04 MED ORDER — BENZONATATE 100 MG PO CAPS: 200.0000 mg | ORAL_CAPSULE | Freq: Three times a day (TID) | ORAL | 0 refills | Status: DC

## 2021-10-04 NOTE — ED Triage Notes (Signed)
Patient c/o cough, congestion, and fever since Tuesday.  Patient wants to be tested for covid.

## 2021-10-04 NOTE — ED Provider Notes (Signed)
[facility]    CSN: [id number] Arrival date & time: 10/04/21  1313      History   Chief Complaint Chief Complaint  Patient presents with   Cough   Fever    HPI Erica Edwards is a 81 y.o. female.   HPI  81 year old female here for evaluation of respiratory complaints.  Patient reports that for last 3 days she has been experiencing nasal congestion, subjective fever, cough.  She states that over the last 3 days her cough is decreased and today she has not had much in the way of a cough at all.  Her cough has been nonproductive.  She denies runny nose, ear pain, sore throat, shortness of breath, wheezing, or GI complaints.  She is here because she concerned she may have COVID and she would like to be tested.  She denies having other sick contacts.  Past Medical History:  Diagnosis Date   Atrial fibrillation (HCC)    Depression    Osteoarthritis    Vertigo     There are no problems to display for this patient.   History reviewed. No pertinent surgical history.  OB History   No obstetric history on file.      Home Medications    Prior to Admission medications   Medication Sig Start Date End Date Taking? Authorizing Provider  amLODipine (NORVASC) 10 MG tablet TAKE ONE-HALF TABLET BY MOUTH EVERY DAY FOR BLOOD PRESSURE 04/02/21  Yes [provider]  apixaban (ELIQUIS) 5 MG TABS tablet TAKE ONE TABLET BY MOUTH EVERY 12 HOURS - CAUTION BLOOD THINNER 05/31/20  Yes [provider]  benzonatate (TESSALON) 100 MG capsule Take 2 capsules (200 mg total) by mouth every 8 (eight) hours. 10/04/21  Yes Becky Augusta, NP  doxycycline (VIBRAMYCIN) 100 MG capsule Take 1 capsule (100 mg total) by mouth 2 (two) times daily. 09/25/21  Yes Becky Augusta, NP  gabapentin (NEURONTIN) 300 MG capsule TAKE TWO CAPSULES BY MOUTH THREE TIMES A DAY FOR NERVE PAIN *MAY CAUSE DROWSINESS* 04/02/21  Yes [provider]  ipratropium (ATROVENT) 0.06 % nasal spray Place 2  sprays into both nostrils 4 (four) times daily. 10/04/21  Yes Becky Augusta, NP  levothyroxine (SYNTHROID) 112 MCG tablet Take 1 tablet (112 mcg total) by mouth daily before breakfast. 05/13/21 10/04/21 Yes Lamptey, Britta Mccreedy, MD  lisinopril (ZESTRIL) 40 MG tablet TAKE ONE-HALF TABLET BY MOUTH EVERY DAY FOR BLOOD PRESSURE 04/02/21  Yes [provider]  molnupiravir EUA (LAGEVRIO) 200 mg CAPS capsule Take 4 capsules (800 mg total) by mouth 2 (two) times daily for 5 days. 10/04/21 10/09/21 Yes Becky Augusta, NP  trospium (SANCTURA) 20 MG tablet Take by mouth.   Yes [provider]  diclofenac Sodium (VOLTAREN) 1 % GEL APPLY 4 GRAMS TOPICALLY TWO TIMES A DAY AS NEEDED PUT ON KNEES FOR PAIN AS DIRECTED. USE GLOVES TO APPLY. 04/02/21   [provider]    Family History History reviewed. No pertinent family history.  Social History Social History   Tobacco Use   Smoking status: Never   Smokeless tobacco: Never  Vaping Use   Vaping Use: Never used  Substance Use Topics   Alcohol use: Never   Drug use: Never     Allergies   Azithromycin, Gabapentin, Naproxen, Nortriptyline, Sertraline, Venlafaxine, Penicillin g, and Streptomycin   Review of Systems Review of Systems  Constitutional:  Positive for fever.  HENT:  Positive for congestion. Negative for ear pain, rhinorrhea and sore  throat.   Respiratory:  Positive for cough. Negative for shortness of breath and wheezing.   Gastrointestinal:  Negative for diarrhea, nausea and vomiting.  Musculoskeletal:  Negative for arthralgias and myalgias.  Skin:  Negative for rash.  Neurological:  Negative for headaches.  Hematological: Negative.   Psychiatric/Behavioral: Negative.       Physical Exam Triage Vital Signs ED Triage Vitals  Enc Vitals Group     BP --      Pulse --      Resp --      Temp --      Temp src --      SpO2 --      Weight 10/04/21 1345 178 lb (80.7 kg)     Height 10/04/21 1345 4\' 11"  (1.499 m)      Head Circumference --      Peak Flow --      Pain Score 10/04/21 1344 0     Pain Loc --      Pain Edu? --      Excl. in GC? --    No data found.  Updated Vital Signs BP 127/77 (BP Location: Left Arm)   Pulse 83   Temp 98.3 F (36.8 C) (Oral)   Resp 14   Ht 4\' 11"  (1.499 m)   Wt 178 lb (80.7 kg)   LMP  (LMP Unknown)   SpO2 96%   BMI 35.95 kg/m   Visual Acuity Right Eye Distance:   Left Eye Distance:   Bilateral Distance:    Right Eye Near:   Left Eye Near:    Bilateral Near:     Physical Exam Vitals and nursing note reviewed.  Constitutional:      Appearance: Normal appearance. She is not ill-appearing.  HENT:     Head: Normocephalic and atraumatic.     Right Ear: Tympanic membrane, ear canal and external ear normal. There is no impacted cerumen.     Left Ear: Tympanic membrane, ear canal and external ear normal. There is no impacted cerumen.     Nose: Congestion and rhinorrhea present.     Mouth/Throat:     Mouth: Mucous membranes are moist.     Pharynx: Oropharynx is clear. No oropharyngeal exudate or posterior oropharyngeal erythema.  Cardiovascular:     Rate and Rhythm: Normal rate and regular rhythm.     Pulses: Normal pulses.     Heart sounds: Normal heart sounds. No murmur heard.    No friction rub. No gallop.  Pulmonary:     Effort: Pulmonary effort is normal.     Breath sounds: Normal breath sounds. No wheezing, rhonchi or rales.  Musculoskeletal:     Cervical back: Normal range of motion and neck supple.  Skin:    General: Skin is warm and dry.     Capillary Refill: Capillary refill takes less than 2 seconds.     Findings: No erythema or rash.  Neurological:     General: No focal deficit present.     Mental Status: She is alert and oriented to person, place, and time.  Psychiatric:        Mood and Affect: Mood normal.        Behavior: Behavior normal.        Thought Content: Thought content normal.        Judgment: Judgment normal.      UC  Treatments / Results  Labs (all labs ordered are listed, but only abnormal results are displayed) Labs  Reviewed  SARS CORONAVIRUS 2 BY RT PCR - Abnormal; Notable for the following components:      Result Value   SARS Coronavirus 2 by RT PCR POSITIVE (*)    All other components within normal limits    EKG   Radiology No results found.  Procedures Procedures (including critical care time)  Medications Ordered in UC Medications - No data to display  Initial Impression / Assessment and Plan / UC Course  I have reviewed the triage vital signs and the nursing notes.  Pertinent labs & imaging results that were available during my care of the patient were reviewed by me and considered in my medical decision making (see chart for details).  Patient is a nontoxic-appearing 81 year old female here for evaluation of subjective fever, nasal congestion, nonproductive cough; for last 3 days.  She denies any other upper or lower respiratory symptoms.  No GI symptoms.  She is concerned she may have COVID and would like to be tested.  Physical exam reveals pearly-gray tympanic membranes bilaterally with normal light reflex and clear external auditory canals.  Nasal mucosa is erythematous edematous with scant clear discharge in both nares.  Oropharyngeal exam is benign.  No cervical lymphadenopathy appreciable exam.  Cardiopulmonary exam with S1-S2 heart sounds with regular rate and rhythm and lung sounds are" all fields.  Patient does have findings consistent with an upper respiratory infection on physical exam.  Will check COVID PCR.  COVID PCR is positive.  I will discharge patient home on molnupiravir twice daily for 5 days for treatment of COVID-19 as well as Tessalon Perles to help with cough.  Atrovent nasal spray to nasal congestion.   Final Clinical Impressions(s) / UC Diagnoses   Final diagnoses:  COVID-19     Discharge Instructions      You have tested positive for COVID-19 today.   You will need to quarantine for 5 days from the onset of your symptoms.  Take the molnupiravir twice daily for 5 days for treatment of COVID-19.  Use over-the-counter Tylenol as needed for headache or fever.  Use the Atrovent nasal spray, 2 squirts up each nostril every 6 hours, as needed for nasal congestion.  Use the Tessalon Perles every 8 hours as needed for cough.  If you develop any shortness of breath, especially at rest, you are unable to speak in full sentences, you feel unable to catch her breath, or your lips begin turning blue please call 911 and go to the ER.     ED Prescriptions     Medication Sig Dispense Auth. Provider   molnupiravir EUA (LAGEVRIO) 200 mg CAPS capsule Take 4 capsules (800 mg total) by mouth 2 (two) times daily for 5 days. 40 capsule Becky Augusta, NP   ipratropium (ATROVENT) 0.06 % nasal spray Place 2 sprays into both nostrils 4 (four) times daily. 15 mL Becky Augusta, NP   benzonatate (TESSALON) 100 MG capsule Take 2 capsules (200 mg total) by mouth every 8 (eight) hours. 21 capsule Becky Augusta, NP      PDMP not reviewed this encounter.   Becky Augusta, NP 10/04/21 1430

## 2021-10-04 NOTE — Discharge Instructions (Addendum)
You have tested positive for COVID-19 today.  You will need to quarantine for 5 days from the onset of your symptoms.  Take the molnupiravir twice daily for 5 days for treatment of COVID-19.  Use over-the-counter Tylenol as needed for headache or fever.  Use the Atrovent nasal spray, 2 squirts up each nostril every 6 hours, as needed for nasal congestion.  Use the Tessalon Perles every 8 hours as needed for cough.  If you develop any shortness of breath, especially at rest, you are unable to speak in full sentences, you feel unable to catch her breath, or your lips begin turning blue please call 911 and go to the ER.

## 2021-10-09 ENCOUNTER — Ambulatory Visit
Admission: EM | Admit: 2021-10-09 | Discharge: 2021-10-09 | Discharge status: Against Medical Advice | Payer: No Typology Code available for payment source

## 2021-10-11 ENCOUNTER — Ambulatory Visit
Admission: EM | Admit: 2021-10-11 | Discharge: 2021-10-11 | Disposition: A | Discharge status: Home / Self Care | Payer: No Typology Code available for payment source | Attending: Nurse Practitioner | Admitting: Nurse Practitioner

## 2021-10-11 ENCOUNTER — Encounter: Payer: Self-pay | Admitting: Emergency Medicine

## 2021-10-11 DIAGNOSIS — N390 Urinary tract infection, site not specified: Secondary | ICD-10-CM

## 2021-10-11 DIAGNOSIS — R059 Cough, unspecified: Secondary | ICD-10-CM | POA: Diagnosis present

## 2021-10-11 DIAGNOSIS — R110 Nausea: Secondary | ICD-10-CM

## 2021-10-11 LAB — URINALYSIS, ROUTINE W REFLEX MICROSCOPIC
Bilirubin Urine: NEGATIVE
Glucose, UA: NEGATIVE mg/dL
Hgb urine dipstick: NEGATIVE
Ketones, ur: NEGATIVE mg/dL
Nitrite: NEGATIVE
Protein, ur: NEGATIVE mg/dL
Specific Gravity, Urine: 1.01 (ref 1.005–1.030)
pH: 7 (ref 5.0–8.0)

## 2021-10-11 LAB — URINALYSIS, MICROSCOPIC (REFLEX): RBC / HPF: NONE SEEN RBC/hpf (ref 0–5)

## 2021-10-11 MED ORDER — ONDANSETRON HCL 4 MG PO TABS: 4.0000 mg | ORAL_TABLET | Freq: Four times a day (QID) | ORAL | 0 refills | Status: DC

## 2021-10-11 MED ORDER — BENZONATATE 100 MG PO CAPS: 200.0000 mg | ORAL_CAPSULE | Freq: Three times a day (TID) | ORAL | 0 refills | Status: DC

## 2021-10-11 MED ORDER — NITROFURANTOIN MONOHYD MACRO 100 MG PO CAPS
100.0000 mg | ORAL_CAPSULE | Freq: Two times a day (BID) | ORAL | 0 refills | Status: DC
Start: 2021-10-11 — End: 2021-10-13

## 2021-10-11 NOTE — ED Provider Notes (Signed)
[facility]    CSN: [id number] Arrival date & time: 10/11/21  5643      History   Chief Complaint Chief Complaint  Patient presents with   Nausea   Diarrhea   Urinary Frequency    HPI Erica Edwards is a 81 y.o. female.   HPI  Erica Edwards is in today for pressure with frequent urination for 2 weeks. She has frequency with nausea. She is on Eliquis and reports dark stools but she has not phoned her PCP. Denies fever, chills, headache, visual changes, shortness of breath, dyspnea on exertion, chest pain, vomiting,  constipation, or any edema.    Past Medical History:  Diagnosis Date   Atrial fibrillation (HCC)    Depression    Osteoarthritis    Vertigo     There are no problems to display for this patient.   History reviewed. No pertinent surgical history.  OB History   No obstetric history on file.      Home Medications    Prior to Admission medications   Medication Sig Start Date End Date Taking? Authorizing Provider  nitrofurantoin, macrocrystal-monohydrate, (MACROBID) 100 MG capsule Take 1 capsule (100 mg total) by mouth 2 (two) times daily. 10/11/21  Yes Kaivon Livesey, Shana Chute, NP  ondansetron (ZOFRAN) 4 MG tablet Take 1 tablet (4 mg total) by mouth every 6 (six) hours. 10/11/21  Yes Trusten Hume, Shana Chute, NP  amLODipine (NORVASC) 10 MG tablet TAKE ONE-HALF TABLET BY MOUTH EVERY DAY FOR BLOOD PRESSURE 04/02/21   [provider]  apixaban (ELIQUIS) 5 MG TABS tablet TAKE ONE TABLET BY MOUTH EVERY 12 HOURS - CAUTION BLOOD THINNER 05/31/20   [provider]  benzonatate (TESSALON) 100 MG capsule Take 2 capsules (200 mg total) by mouth every 8 (eight) hours. 10/11/21   Barbette Merino, NP  diclofenac Sodium (VOLTAREN) 1 % GEL APPLY 4 GRAMS TOPICALLY TWO TIMES A DAY AS NEEDED PUT ON KNEES FOR PAIN AS DIRECTED. USE GLOVES TO APPLY. 04/02/21   [provider]  doxycycline (VIBRAMYCIN) 100 MG capsule Take 1 capsule (100 mg total) by mouth 2 (two)  times daily. 09/25/21   Becky Augusta, NP  gabapentin (NEURONTIN) 300 MG capsule TAKE TWO CAPSULES BY MOUTH THREE TIMES A DAY FOR NERVE PAIN *MAY CAUSE DROWSINESS* 04/02/21   [provider]  ipratropium (ATROVENT) 0.06 % nasal spray Place 2 sprays into both nostrils 4 (four) times daily. 10/04/21   Becky Augusta, NP  levothyroxine (SYNTHROID) 112 MCG tablet Take 1 tablet (112 mcg total) by mouth daily before breakfast. 05/13/21 10/04/21  Lamptey, Britta Mccreedy, MD  lisinopril (ZESTRIL) 40 MG tablet TAKE ONE-HALF TABLET BY MOUTH EVERY DAY FOR BLOOD PRESSURE 04/02/21   [provider]  trospium (SANCTURA) 20 MG tablet Take by mouth.    [provider]    Family History History reviewed. No pertinent family history.  Social History Social History   Tobacco Use   Smoking status: Never   Smokeless tobacco: Never  Vaping Use   Vaping Use: Never used  Substance Use Topics   Alcohol use: Never   Drug use: Never     Allergies   Azithromycin, Gabapentin, Naproxen, Nortriptyline, Sertraline, Venlafaxine, Penicillin g, and Streptomycin   Review of Systems Review of Systems   Physical Exam Triage Vital Signs ED Triage Vitals  Enc Vitals Group     BP 10/11/21 1101 115/90     Pulse Rate 10/11/21 1101 90     Resp 10/11/21  1101 14     Temp 10/11/21 1101 98.6 F (37 C)     Temp Source 10/11/21 1101 Oral     SpO2 10/11/21 1101 97 %     Weight 10/11/21 1059 177 lb 14.6 oz (80.7 kg)     Height 10/11/21 1059 4\' 11"  (1.499 m)     Head Circumference --      Peak Flow --      Pain Score 10/11/21 1059 0     Pain Loc --      Pain Edu? --      Excl. in GC? --    No data found.  Updated Vital Signs BP 115/90 (BP Location: Left Arm)   Pulse 90   Temp 98.6 F (37 C) (Oral)   Resp 14   Ht 4\' 11"  (1.499 m)   Wt 177 lb 14.6 oz (80.7 kg)   LMP  (LMP Unknown)   SpO2 97%   BMI 35.93 kg/m   Visual Acuity Right Eye Distance:   Left Eye Distance:   Bilateral Distance:     Right Eye Near:   Left Eye Near:    Bilateral Near:     Physical Exam Constitutional:      General: She is not in acute distress.    Appearance: She is not ill-appearing, toxic-appearing or diaphoretic.  HENT:     Head: Normocephalic.  Cardiovascular:     Rate and Rhythm: Normal rate.  Pulmonary:     Effort: Pulmonary effort is normal.  Musculoskeletal:        General: Normal range of motion.  Skin:    Capillary Refill: Capillary refill takes less than 2 seconds.     Coloration: Skin is pale.  Neurological:     Mental Status: She is alert and oriented to person, place, and time.  Psychiatric:     Comments: Agitated about being in the clinic; ready to go.       UC Treatments / Results  Labs (all labs ordered are listed, but only abnormal results are displayed) Labs Reviewed  URINALYSIS, ROUTINE W REFLEX MICROSCOPIC - Abnormal; Notable for the following components:      Result Value   Leukocytes,Ua MODERATE (*)    All other components within normal limits  URINALYSIS, MICROSCOPIC (REFLEX) - Abnormal; Notable for the following components:   Bacteria, UA FEW (*)    All other components within normal limits  URINE CULTURE    EKG   Radiology No results found.  Procedures Procedures (including critical care time)  Medications Ordered in UC Medications - No data to display  Initial Impression / Assessment and Plan / UC Course  I have reviewed the triage vital signs and the nursing notes.  Pertinent labs & imaging results that were available during my care of the patient were reviewed by me and considered in my medical decision making (see chart for details).     UTI Cough Nausea  Final Clinical Impressions(s) / UC Diagnoses   Final diagnoses:  Lower urinary tract infectious disease  Nausea without vomiting  Cough, unspecified type     Discharge Instructions      Macrobid 100 mg twice a day for 5 days  Urine culture pending Zofran for  nausea Continue with the Benzonatate for the cough [facility] today     ED Prescriptions     Medication Sig Dispense Auth. Provider   ondansetron (ZOFRAN) 4 MG tablet Take 1 tablet (4 mg total) by  mouth every 6 (six) hours. 12 tablet Thad Ranger M, NP   nitrofurantoin, macrocrystal-monohydrate, (MACROBID) 100 MG capsule Take 1 capsule (100 mg total) by mouth 2 (two) times daily. 10 capsule Barbette Merino, NP   benzonatate (TESSALON) 100 MG capsule Take 2 capsules (200 mg total) by mouth every 8 (eight) hours. 21 capsule Barbette Merino, NP      PDMP not reviewed this encounter.   Thad Ranger [place], [place] 10/11/21 1139

## 2021-10-11 NOTE — ED Triage Notes (Signed)
Patient c/o bladder pressure and urinary frequency that started 2 weeks ago.  Patient states that since she has had COVID she has been having diarrhea, ongoing cough, and nausea. Patient denies fevers or chills.

## 2021-10-11 NOTE — Discharge Instructions (Addendum)
Macrobid 100 mg twice a day for 5 days  Urine culture pending Zofran for nausea Continue with the Benzonatate for the cough [facility] today

## 2021-10-13 ENCOUNTER — Telehealth (HOSPITAL_COMMUNITY): Payer: Self-pay | Admitting: Emergency Medicine

## 2021-10-13 LAB — URINE CULTURE: Culture: >=100000 — AB

## 2021-10-13 MED ORDER — SULFAMETHOXAZOLE-TRIMETHOPRIM 800-160 MG PO TABS: 1.0000 | ORAL_TABLET | Freq: Two times a day (BID) | ORAL | 0 refills | Status: AC

## 2021-12-27 ENCOUNTER — Ambulatory Visit
Admission: EM | Admit: 2021-12-27 | Discharge: 2021-12-27 | Disposition: A | Discharge status: Home / Self Care | Payer: No Typology Code available for payment source | Attending: Emergency Medicine | Admitting: Emergency Medicine

## 2021-12-27 ENCOUNTER — Encounter: Payer: Self-pay | Admitting: Emergency Medicine

## 2021-12-27 DIAGNOSIS — N390 Urinary tract infection, site not specified: Secondary | ICD-10-CM | POA: Diagnosis not present

## 2021-12-27 LAB — URINALYSIS, ROUTINE W REFLEX MICROSCOPIC
Bilirubin Urine: NEGATIVE
Glucose, UA: NEGATIVE mg/dL
Ketones, ur: NEGATIVE mg/dL
Nitrite: POSITIVE — AB
Protein, ur: 30 mg/dL — AB
Specific Gravity, Urine: 1.02 (ref 1.005–1.030)
pH: 5.5 (ref 5.0–8.0)

## 2021-12-27 LAB — URINALYSIS, MICROSCOPIC (REFLEX)

## 2021-12-27 MED ORDER — SULFAMETHOXAZOLE-TRIMETHOPRIM 800-160 MG PO TABS: 1.0000 | ORAL_TABLET | Freq: Two times a day (BID) | ORAL | 0 refills | Status: AC

## 2021-12-27 NOTE — Discharge Instructions (Signed)
Take the Bactrim twice daily for 5 days with food for treatment of urinary tract infection.  Increase your oral fluid intake so that you increase your urine production and or flushing your urinary system.  Take an over-the-counter probiotic, such as Culturelle-Align-Activia, 1 hour after each dose of antibiotic to prevent diarrhea or yeast infections from forming.  We will culture urine and change the antibiotics if necessary.  Return for reevaluation, or see your primary care provider, for any new or worsening symptoms.

## 2021-12-27 NOTE — ED Provider Notes (Signed)
[facility]    CSN: [id number] Arrival date & time: 12/27/21  3664      History   Chief Complaint Chief Complaint  Patient presents with   Urinary Frequency    HPI Erica Edwards is a 81 y.o. female.   HPI  81 year old female here for evaluation of urinary complaints.  Patient reports that for the last 4 days she has been experiencing urinary urgency and frequency along with bladder pressure.  She denies any pain with urination, cloudiness to her urine, or blood in her urine.  She also denies any low back pain.  Patient was seen here in July for UTI and prescribed Macrobid.  Culture at that time grew out Proteus Mirabilis with a resistance to Macrobid so she was switched to Bactrim.  Past Medical History:  Diagnosis Date   Atrial fibrillation (HCC)    Depression    Osteoarthritis    Vertigo     There are no problems to display for this patient.   History reviewed. No pertinent surgical history.  OB History   No obstetric history on file.      Home Medications    Prior to Admission medications   Medication Sig Start Date End Date Taking? Authorizing Provider  amLODipine (NORVASC) 10 MG tablet TAKE ONE-HALF TABLET BY MOUTH EVERY DAY FOR BLOOD PRESSURE 04/02/21  Yes [provider]  apixaban (ELIQUIS) 5 MG TABS tablet TAKE ONE TABLET BY MOUTH EVERY 12 HOURS - CAUTION BLOOD THINNER 05/31/20  Yes [provider]  gabapentin (NEURONTIN) 300 MG capsule TAKE TWO CAPSULES BY MOUTH THREE TIMES A DAY FOR NERVE PAIN *MAY CAUSE DROWSINESS* 04/02/21  Yes [provider]  levothyroxine (SYNTHROID) 112 MCG tablet Take 1 tablet (112 mcg total) by mouth daily before breakfast. 05/13/21 12/27/21 Yes Lamptey, Britta Mccreedy, MD  lisinopril (ZESTRIL) 40 MG tablet TAKE ONE-HALF TABLET BY MOUTH EVERY DAY FOR BLOOD PRESSURE 04/02/21  Yes [provider]  sulfamethoxazole-trimethoprim (BACTRIM DS) 800-160 MG tablet Take 1 tablet by mouth 2 (two) times  daily for 5 days. 12/27/21 01/01/22 Yes Becky Augusta, NP  ipratropium (ATROVENT) 0.06 % nasal spray Place 2 sprays into both nostrils 4 (four) times daily. 10/04/21   Becky Augusta, NP  ondansetron (ZOFRAN) 4 MG tablet Take 1 tablet (4 mg total) by mouth every 6 (six) hours. 10/11/21   Barbette Merino, NP  trospium (SANCTURA) 20 MG tablet Take by mouth.    [provider]    Family History History reviewed. No pertinent family history.  Social History Social History   Tobacco Use   Smoking status: Never   Smokeless tobacco: Never  Vaping Use   Vaping Use: Never used  Substance Use Topics   Alcohol use: Never   Drug use: Never     Allergies   Azithromycin, Gabapentin, Naproxen, Nortriptyline, Sertraline, Venlafaxine, Penicillin g, and Streptomycin   Review of Systems Review of Systems  Constitutional:  Negative for fever.  Gastrointestinal:  Negative for abdominal pain, nausea and vomiting.  Genitourinary:  Positive for frequency and urgency. Negative for dysuria and hematuria.  Musculoskeletal:  Negative for back pain.     Physical Exam Triage Vital Signs ED Triage Vitals  Enc Vitals Group     BP 12/27/21 0838 114/68     Pulse Rate 12/27/21 0838 75     Resp 12/27/21 0838 14     Temp 12/27/21 0838 97.6 F (36.4 C)     Temp Source 12/27/21 0838 Oral  SpO2 12/27/21 0838 95 %     Weight 12/27/21 0834 175 lb (79.4 kg)     Height 12/27/21 0834 4\' 11"  (1.499 m)     Head Circumference --      Peak Flow --      Pain Score 12/27/21 0834 0     Pain Loc --      Pain Edu? --      Excl. in [place]? --    No data found.  Updated Vital Signs BP 114/68 (BP Location: Left Arm)   Pulse 75   Temp 97.6 F (36.4 C) (Oral)   Resp 14   Ht 4\' 11"  (1.499 m)   Wt 175 lb (79.4 kg)   LMP  (LMP Unknown)   SpO2 95%   BMI 35.35 kg/m   Visual Acuity Right Eye Distance:   Left Eye Distance:   Bilateral Distance:    Right Eye Near:   Left Eye Near:    Bilateral Near:      Physical Exam Vitals and nursing note reviewed.  Constitutional:      Appearance: Normal appearance. She is not ill-appearing.  HENT:     Head: Normocephalic and atraumatic.  Cardiovascular:     Rate and Rhythm: Normal rate.     Pulses: Normal pulses.     Heart sounds: Normal heart sounds. No murmur heard.    No friction rub. No gallop.  Pulmonary:     Effort: Pulmonary effort is normal.     Breath sounds: Normal breath sounds. No wheezing, rhonchi or rales.  Abdominal:     Tenderness: There is no right CVA tenderness or left CVA tenderness.  Skin:    Capillary Refill: Capillary refill takes less than 2 seconds.  Neurological:     General: No focal deficit present.     Mental Status: She is alert and oriented to person, place, and time.  Psychiatric:        Mood and Affect: Mood normal.        Behavior: Behavior normal.        Thought Content: Thought content normal.        Judgment: Judgment normal.      UC Treatments / Results  Labs (all labs ordered are listed, but only abnormal results are displayed) Labs Reviewed  URINALYSIS, ROUTINE W REFLEX MICROSCOPIC - Abnormal; Notable for the following components:      Result Value   APPearance CLOUDY (*)    Hgb urine dipstick SMALL (*)    Protein, ur 30 (*)    Nitrite POSITIVE (*)    Leukocytes,Ua MODERATE (*)    All other components within normal limits  URINALYSIS, MICROSCOPIC (REFLEX) - Abnormal; Notable for the following components:   Bacteria, UA MANY (*)    All other components within normal limits  URINE CULTURE    EKG   Radiology No results found.  Procedures Procedures (including critical care time)  Medications Ordered in UC Medications - No data to display  Initial Impression / Assessment and Plan / UC Course  I have reviewed the triage vital signs and the nursing notes.  Pertinent labs & imaging results that were available during my care of the patient were reviewed by me and considered in my  medical decision making (see chart for details).   Patient is a nontoxic-appearing 81 year old female here for evaluation of urinary complaints outlined in the HPI above.  Patient is in no acute distress and her physical exam reveals a benign  cardiopulmonary exam with clear lung sounds in all fields.  She has no CVA tenderness on exam.  Abdomen is soft and flat.  Urinalysis was collected at triage which is leukocyte esterase and nitrite positive.  Also small hemoglobin and 30 of protein.  The reflex microscopy shows too numerous to count WBCs and many bacteria.  I am sending the urine for culture.  In July she had a urinary tract infection that grew out Proteus Mirabilis and was resistant to Macrobid.  She was switched to Bactrim at that time and her symptoms resolved.  I will discharge the patient home on Bactrim twice daily for 5 days while the culture is pending.  If we need to adjust the antibiotics at that time we can.  Return precautions reviewed.   Final Clinical Impressions(s) / UC Diagnoses   Final diagnoses:  Lower urinary tract infectious disease     Discharge Instructions      Take the Bactrim twice daily for 5 days with food for treatment of urinary tract infection.  Increase your oral fluid intake so that you increase your urine production and or flushing your urinary system.  Take an over-the-counter probiotic, such as Culturelle-Align-Activia, 1 hour after each dose of antibiotic to prevent diarrhea or yeast infections from forming.  We will culture urine and change the antibiotics if necessary.  Return for reevaluation, or see your primary care provider, for any new or worsening symptoms.      ED Prescriptions     Medication Sig Dispense Auth. Provider   sulfamethoxazole-trimethoprim (BACTRIM DS) 800-160 MG tablet Take 1 tablet by mouth 2 (two) times daily for 5 days. 10 tablet Becky Augusta, NP      PDMP not reviewed this encounter.   Becky Augusta, NP 12/27/21  [phone]

## 2021-12-27 NOTE — ED Triage Notes (Signed)
Patient c/o urinary urgency and frequency that started 4 days.  Patient denies any blood in her urine.

## 2021-12-29 LAB — URINE CULTURE: Culture: >=100000 — AB

## 2022-01-10 ENCOUNTER — Ambulatory Visit (INDEPENDENT_AMBULATORY_CARE_PROVIDER_SITE_OTHER): Payer: No Typology Code available for payment source

## 2022-01-10 ENCOUNTER — Ambulatory Visit
Admission: EM | Admit: 2022-01-10 | Discharge: 2022-01-10 | Disposition: A | Discharge status: Home / Self Care | Payer: No Typology Code available for payment source | Attending: Family Medicine | Admitting: Family Medicine

## 2022-01-10 DIAGNOSIS — M79605 Pain in left leg: Secondary | ICD-10-CM | POA: Diagnosis present

## 2022-01-10 DIAGNOSIS — M25562 Pain in left knee: Secondary | ICD-10-CM | POA: Diagnosis not present

## 2022-01-10 DIAGNOSIS — M545 Low back pain, unspecified: Secondary | ICD-10-CM

## 2022-01-10 DIAGNOSIS — M25552 Pain in left hip: Secondary | ICD-10-CM | POA: Insufficient documentation

## 2022-01-10 DIAGNOSIS — G8929 Other chronic pain: Secondary | ICD-10-CM | POA: Diagnosis present

## 2022-01-10 LAB — TSH: TSH: 14.44 u[IU]/mL — ABNORMAL HIGH (ref 0.350–4.500)

## 2022-01-10 MED ORDER — DEXAMETHASONE SODIUM PHOSPHATE 10 MG/ML IJ SOLN
10.0000 mg | Freq: Once | INTRAMUSCULAR | Status: AC
  Administered 2022-01-10: 10 mg via INTRAMUSCULAR

## 2022-01-10 MED ORDER — TRAMADOL HCL 50 MG PO TABS: 50.0000 mg | ORAL_TABLET | Freq: Two times a day (BID) | ORAL | 0 refills | Status: DC | PRN

## 2022-01-10 NOTE — ED Triage Notes (Signed)
Patient reports that she has pain in her left hip and leg. Patient reports that she has had this pain for about 2 years and its only getting worse.

## 2022-01-10 NOTE — Discharge Instructions (Addendum)
Your x-rays showed significant arthritis in your back and hip.  Someone should call you to schedule an appointment with a primary care provider outside of the [place].  You were given a shot of steroids today to help with your pain.  Continue taking gabapentin as prescribed.  Additionally, take Tylenol at 1000 mg 3 times a day as needed for pain.  Consider stopping by the pharmacy or dollar store to pick up some lidocaine patches.  Apply for 12 hours and then remove.  You can then reapply the next day.  We will let you know whether you need to change your medications related to your thyroid.

## 2022-01-10 NOTE — ED Provider Notes (Addendum)
[facility]    CSN: [id number] Arrival date & time: 01/10/22  1500      History   Chief Complaint Chief Complaint  Patient presents with   Hip Pain    Left     HPI  HPI Erica Edwards is a 81 y.o. female.   Erica Edwards presents for low back pain, left hip and knee pain.  She states she typically has this kind of pain however is gotten worse recently.  She takes gabapentin 3 times a day and it is not providing her any relief.  [facility] because she has to walk too far and they do not have Valet anymore.  Feels like her leg is weak and uses a cane but no more weak than normal.  Pain is worse at night.  She has increased pain with sitting and walking.  Uses a cane to get around.  Not having any new bowel or bladder incontinence.  No recent falls or injury.  States that she has been having more and more hair loss.  She takes thyroid medicine daily.  Reports it had to be increased to last time.  She has not seen her primary care provider for this in a while.    Past Medical History:  Diagnosis Date   Atrial fibrillation ([place])    Depression    Osteoarthritis    Vertigo     There are no problems to display for this patient.   Past Surgical History:  Procedure Laterality Date   ABDOMINAL HYSTERECTOMY      OB History   No obstetric history on file.      Home Medications    Prior to Admission medications   Medication Sig Start Date End Date Taking? Authorizing Provider  traMADol (ULTRAM) 50 MG tablet Take 1 tablet (50 mg total) by mouth every 12 (twelve) hours as needed. 01/10/22  Yes Bryen Hinderman, DO  amLODipine (NORVASC) 10 MG tablet TAKE ONE-HALF TABLET BY MOUTH EVERY DAY FOR BLOOD PRESSURE 04/02/21   [provider]  apixaban (ELIQUIS) 5 MG TABS tablet TAKE ONE TABLET BY MOUTH EVERY 12 HOURS - CAUTION BLOOD THINNER 05/31/20   [provider]  gabapentin (NEURONTIN) 300 MG capsule TAKE TWO  CAPSULES BY MOUTH THREE TIMES A DAY FOR NERVE PAIN *MAY CAUSE DROWSINESS* 04/02/21   [provider]  ipratropium (ATROVENT) 0.06 % nasal spray Place 2 sprays into both nostrils 4 (four) times daily. 10/04/21   Margarette Canada, NP  levothyroxine (SYNTHROID) 112 MCG tablet Take 1 tablet (112 mcg total) by mouth daily before breakfast. 05/13/21 12/27/21  Lamptey, Myrene Galas, MD  lisinopril (ZESTRIL) 40 MG tablet TAKE ONE-HALF TABLET BY MOUTH EVERY DAY FOR BLOOD PRESSURE 04/02/21   [provider]  ondansetron (ZOFRAN) 4 MG tablet Take 1 tablet (4 mg total) by mouth every 6 (six) hours. 10/11/21   Vevelyn Francois, NP  trospium (SANCTURA) 20 MG tablet Take by mouth.    [provider]    Family History No family history on file.  Social History Social History   Tobacco Use   Smoking status: Never   Smokeless tobacco: Never  Vaping Use   Vaping Use: Never used  Substance Use Topics   Alcohol use: Never   Drug use: Never     Allergies   Azithromycin, Gabapentin, Naproxen, Nortriptyline, Sertraline, Venlafaxine, Penicillin g, and Streptomycin   Review of Systems Review of Systems: egative  unless otherwise stated in HPI.      Physical Exam Triage Vital Signs ED Triage Vitals  Enc Vitals Group     BP 01/10/22 1513 124/72     Pulse Rate 01/10/22 1513 86     Resp --      Temp 01/10/22 1513 98.2 F (36.8 C)     Temp Source 01/10/22 1513 Oral     SpO2 01/10/22 1513 93 %     Weight 01/10/22 1512 175 lb (79.4 kg)     Height 01/10/22 1512 4\' 11"  (1.499 m)     Head Circumference --      Peak Flow --      Pain Score 01/10/22 1511 10     Pain Loc --      Pain Edu? --      Excl. in [place]? --    No data found.  Updated Vital Signs BP 124/72 (BP Location: Left Arm)   Pulse 86   Temp 98.2 F (36.8 C) (Oral)   Ht 4\' 11"  (1.499 m)   Wt 79.4 kg   LMP  (LMP Unknown)   SpO2 93%   BMI 35.35 kg/m   Visual Acuity Right Eye Distance:   Left Eye Distance:    Bilateral Distance:    Right Eye Near:   Left Eye Near:    Bilateral Near:     Physical Exam GEN: well appearing female in no acute distress  CVS: well perfused  RESP: speaking in full sentences without pause, no respiratory distress  MSK:  Left hip: Tender to palp palpation posteriorly, pain across her buttocks as well Left knee: Mild edema, no effusion, medial joint line tenderness, no deformities Lumbar spine: - Inspection: no gross deformity or asymmetry, swelling or ecchymosis. No skin changes  - Palpation: TTP over the lumbar spinous processes, tenderness of bilateral lumbar paraspinal muscles, no SI joint tenderness bilaterally - ROM: Limited range of motion due to acute pain - Strength: 5/5 strength of lower extremity in L4-S1 nerve root distributions b/l - Neuro: sensation intact in the L4-S1 nerve root distribution b/l,  - Special testing: Not attempted SKIN: warm, dry, no overly skin rash or erythema    UC Treatments / Results  Labs (all labs ordered are listed, but only abnormal results are displayed) Labs Reviewed  TSH - Abnormal; Notable for the following components:      Result Value   TSH 14.440 (*)    All other components within normal limits    EKG   Radiology DG Lumbar Spine Complete  Result Date: 01/10/2022 CLINICAL DATA:  Chronic pain. EXAM: LUMBAR SPINE - COMPLETE 4+ VIEW COMPARISON:  None Available. FINDINGS: There is diffuse decreased bone mineralization. There are 5 non-rib-bearing lumbar-type vertebral bodies. Normal frontal alignment. Minimal 2 mm retrolisthesis of L1 on L2. Vertebral body heights are maintained. Moderate T10-11 through L1-2, mild L2-3, and minimal L3-4 and disc space narrowing. Moderate to high-grade L5-S1 facet joint arthropathy. IMPRESSION: 1. Moderate to high-grade L5-S1 facet joint arthropathy. 2. Mild to moderate multilevel degenerative disc changes of the lower thoracic and the lumbar spine. Electronically Signed   By:  Neita Garnet M.D.   On: 01/10/2022 16:50   DG Knee Complete 4 Views Left  Result Date: 01/10/2022 CLINICAL DATA:  Chronic left leg pain. EXAM: LEFT KNEE - COMPLETE 4+ VIEW COMPARISON:  None Available. FINDINGS: No evidence of fracture, dislocation, or joint effusion. Moderate narrowing of medial joint space is noted. Osteophyte formation is noted laterally.  Soft tissues are unremarkable. IMPRESSION: Moderate degenerative joint disease is noted medially. No acute abnormality seen. Electronically Signed   By: Marijo Conception M.D.   On: 01/10/2022 16:49   DG Hip Unilat With Pelvis 2-3 Views Left  Result Date: 01/10/2022 CLINICAL DATA:  Worsening chronic left hip pain. EXAM: DG HIP (WITH OR WITHOUT PELVIS) 2-3V LEFT COMPARISON:  None Available. FINDINGS: No acute fracture or hip dislocation is identified. There is mild-to-moderate hip joint space narrowing bilaterally with marginal spurring. Mild degenerative changes are noted involving the SI joints. IMPRESSION: Mild-to-moderate bilateral hip osteoarthrosis. Electronically Signed   By: Logan Bores M.D.   On: 01/10/2022 16:48    Procedures Procedures (including critical care time)  Medications Ordered in UC Medications  dexamethasone (DECADRON) injection 10 mg (10 mg Intramuscular Given 01/10/22 1601)    Initial Impression / Assessment and Plan / UC Course  I have reviewed the triage vital signs and the nursing notes.  Pertinent labs & imaging results that were available during my care of the patient were reviewed by me and considered in my medical decision making (see chart for details).      Pt is a 81 y.o.  female with history of chronic pain and osteoarthritis who presents for acute low back pain, right hip and right knee pain.  Pain not resolving with gabapentin.  Obtained lumbar, left knee and left hip films.  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  Given short course of tramadol for severe pain.  Tylenol 1000 mg 3 times daily as needed.  She would like to find a new PCP that is closer.   Patient to gradually return to normal activities, as tolerated and continue ordinary activities within the limits permitted by pain.  Tramadol, Tylenol, home gabapentin and Lidocaine patches PRN for multimodal pain relief. Counseled patient on red flag symptoms and when to seek immediate care. Patient to follow up with orthopedic provider if symptoms do not improve with conservative treatment.  Return and ED precautions given.    Discussed MDM, treatment plan and plan for follow-up with patient/parent who agrees with plan.   Final Clinical Impressions(s) / UC Diagnoses   Final diagnoses:  Left hip pain  Chronic bilateral low back pain, unspecified whether sciatica present  Pain of left lower extremity     Discharge Instructions      Your x-rays showed significant arthritis in your back and hip.  Someone should call you to schedule an appointment with a primary care provider outside of the [place].  You were given a shot of steroids today to help with your pain.  Continue taking gabapentin as prescribed.  Additionally, take Tylenol at 1000 mg 3 times a day as needed for pain.  Consider stopping by the pharmacy or dollar store to pick up some lidocaine patches.  Apply for 12 hours and then remove.  You can then reapply the next day.  We will let you know whether you need to change your medications related to your thyroid.      ED Prescriptions     Medication Sig Dispense Auth. Provider   traMADol (ULTRAM) 50 MG tablet Take 1 tablet (50 mg total) by mouth every 12 (twelve) hours as needed. 15 tablet Erica Kita, DO      I have reviewed the PDMP during this encounter.   Lyndee Hensen, DO 01/11/22 [phone]

## 2022-01-13 ENCOUNTER — Telehealth (HOSPITAL_COMMUNITY): Payer: Self-pay | Admitting: Emergency Medicine

## 2022-01-13 MED ORDER — LEVOTHYROXINE SODIUM 125 MCG PO TABS: 125.0000 ug | ORAL_TABLET | Freq: Every day | ORAL | 0 refills | Status: AC

## 2022-01-14 ENCOUNTER — Ambulatory Visit
Admission: EM | Admit: 2022-01-14 | Discharge: 2022-01-14 | Disposition: A | Discharge status: Home / Self Care | Payer: No Typology Code available for payment source | Attending: Family Medicine | Admitting: Family Medicine

## 2022-01-14 DIAGNOSIS — Z7901 Long term (current) use of anticoagulants: Secondary | ICD-10-CM | POA: Diagnosis present

## 2022-01-14 DIAGNOSIS — E039 Hypothyroidism, unspecified: Secondary | ICD-10-CM | POA: Diagnosis present

## 2022-01-14 DIAGNOSIS — M25552 Pain in left hip: Secondary | ICD-10-CM

## 2022-01-14 LAB — CBC WITH DIFFERENTIAL/PLATELET
Abs Immature Granulocytes: 0.08 10*3/uL — ABNORMAL HIGH (ref 0.00–0.07)
Basophils Absolute: 0 10*3/uL (ref 0.0–0.1)
Basophils Relative: 0 %
Eosinophils Absolute: 0.3 10*3/uL (ref 0.0–0.5)
Eosinophils Relative: 4 %
HCT: 40 % (ref 36.0–46.0)
Hemoglobin: 13.5 g/dL (ref 12.0–15.0)
Immature Granulocytes: 1 %
Lymphocytes Relative: 25 %
Lymphs Abs: 2.2 10*3/uL (ref 0.7–4.0)
MCH: 29.1 pg (ref 26.0–34.0)
MCHC: 33.8 g/dL (ref 30.0–36.0)
MCV: 86.2 fL (ref 80.0–100.0)
Monocytes Absolute: 0.8 10*3/uL (ref 0.1–1.0)
Monocytes Relative: 9 %
Neutro Abs: 5.5 10*3/uL (ref 1.7–7.7)
Neutrophils Relative %: 61 %
Platelets: 234 10*3/uL (ref 150–400)
RBC: 4.64 MIL/uL (ref 3.87–5.11)
RDW: 14 % (ref 11.5–15.5)
WBC: 9 10*3/uL (ref 4.0–10.5)
nRBC: 0 % (ref 0.0–0.2)

## 2022-01-14 LAB — COMPREHENSIVE METABOLIC PANEL
ALT: 19 U/L (ref 0–44)
AST: 26 U/L (ref 15–41)
Albumin: 4.6 g/dL (ref 3.5–5.0)
Alkaline Phosphatase: 53 U/L (ref 38–126)
Anion gap: 6 (ref 5–15)
BUN: 12 mg/dL (ref 8–23)
CO2: 28 mmol/L (ref 22–32)
Calcium: 9 mg/dL (ref 8.9–10.3)
Chloride: 104 mmol/L (ref 98–111)
Creatinine, Ser: 0.6 mg/dL (ref 0.44–1.00)
GFR, Estimated: >60 mL/min (ref >60)
Glucose, Bld: 101 mg/dL — ABNORMAL HIGH (ref 70–99)
Potassium: 3.3 mmol/L — ABNORMAL LOW (ref 3.5–5.1)
Sodium: 138 mmol/L (ref 135–145)
Total Bilirubin: 0.9 mg/dL (ref 0.3–1.2)
Total Protein: 8.2 g/dL — ABNORMAL HIGH (ref 6.5–8.1)

## 2022-01-14 MED ORDER — DEXAMETHASONE SODIUM PHOSPHATE 10 MG/ML IJ SOLN
10.0000 mg | Freq: Once | INTRAMUSCULAR | Status: AC
  Administered 2022-01-14: 10 mg via INTRAMUSCULAR

## 2022-01-14 NOTE — Discharge Instructions (Addendum)
Were set up for primary care provider appointment today.    Start the 125 mcg levothyroxine, you will need to get your thyroid hormone rechecked on December 1.  You can call your primary care doctor to get this scheduled.  You were given a steroid injection in your left hip to help with the pain.  Continue taking your other pain medications as prescribed. Do not stop gabapentin, you will need to be tapered off this medication.

## 2022-01-14 NOTE — ED Triage Notes (Signed)
Pt c/o left hip pain. Pt states that the pain is going down her leg.  Pt asks for a cortisone injection for the pain.   Pt brought in paperwork from the [place] requesting labwork to be done. Pt was instructed to go to [place] or [place] but refuses.   Pt is on blood thinners and needed lab work to be completed and does not have a PCP.

## 2022-01-14 NOTE — ED Provider Notes (Signed)
[facility]    CSN: [id number] Arrival date & time: 01/14/22  1022      History   Chief Complaint Chief Complaint  Patient presents with   Hip Pain    HPI Erica Edwards is a 81 y.o. female.   HPI  Erica Edwards presents for:  Patient takes Eliquis as prescribed for atrial fibrillation.  Denies any hematuria, hematochezia, melena or nosebleeds.  She got a letter from the [place] stating that she needs to get her blood work updated.  As [facility] coupled with the distance of travel to the [place], Erica Edwards has not been going to her regular scheduled appointments.  She requests to get blood work requested by the [place] who can see her MyChart labs.  She is looking to establish care in the area as she lives nearby.   Patient would like to stop her elevated TSH.  States that she was called about this but wanted to ask a few questions.  She has not yet picked up the new dose of 125 mcg.   She continues to have left leg pain but this has been improved since she was given a short-term course of tramadol.  She wants to switch from gabapentin to tramadol.  Dates that she has not been taking the gabapentin because she likes to take the tramadol instead.    Fever : no  Chills: no Sore throat: no   Cough: no Sputum: no Nasal congestion : no  Appetite: normal  Hydration: normal  Abdominal pain: no Nausea: no Vomiting: no Dysuria: no  Sleep disturbance: no Back Pain: no Headache: no   Past Medical History:  Diagnosis Date   Atrial fibrillation (HCC)    Depression    Osteoarthritis    Vertigo     There are no problems to display for this patient.   Past Surgical History:  Procedure Laterality Date   ABDOMINAL HYSTERECTOMY      OB History   No obstetric history on file.      Home Medications    Prior to Admission medications   Medication Sig Start Date End Date Taking? Authorizing Provider  amLODipine (NORVASC) 10 MG tablet TAKE ONE-HALF TABLET BY  MOUTH EVERY DAY FOR BLOOD PRESSURE 04/02/21  Yes [provider]  apixaban (ELIQUIS) 5 MG TABS tablet TAKE ONE TABLET BY MOUTH EVERY 12 HOURS - CAUTION BLOOD THINNER 05/31/20  Yes [provider]  gabapentin (NEURONTIN) 300 MG capsule TAKE TWO CAPSULES BY MOUTH THREE TIMES A DAY FOR NERVE PAIN *MAY CAUSE DROWSINESS* 04/02/21  Yes [provider]  levothyroxine (SYNTHROID) 125 MCG tablet Take 1 tablet (125 mcg total) by mouth daily before breakfast. 01/13/22 02/12/22 Yes Lamptey, Britta Mccreedy, MD  lisinopril (ZESTRIL) 40 MG tablet TAKE ONE-HALF TABLET BY MOUTH EVERY DAY FOR BLOOD PRESSURE 04/02/21  Yes [provider]  traMADol (ULTRAM) 50 MG tablet Take 1 tablet (50 mg total) by mouth every 12 (twelve) hours as needed. 01/10/22  Yes Mylah Baynes, DO  ipratropium (ATROVENT) 0.06 % nasal spray Place 2 sprays into both nostrils 4 (four) times daily. 10/04/21   Becky Augusta, NP  ondansetron (ZOFRAN) 4 MG tablet Take 1 tablet (4 mg total) by mouth every 6 (six) hours. 10/11/21   Barbette Merino, NP  trospium (SANCTURA) 20 MG tablet Take by mouth.    [provider]    Family History History reviewed. No pertinent family history.  Social History Social History  Tobacco Use   Smoking status: Never   Smokeless tobacco: Never  Vaping Use   Vaping Use: Never used  Substance Use Topics   Alcohol use: Never   Drug use: Never     Allergies   Azithromycin, Gabapentin, Naproxen, Nortriptyline, Sertraline, Venlafaxine, Penicillin g, and Streptomycin   Review of Systems Review of Systems : negative unless otherwise stated in HPI.      Physical Exam Triage Vital Signs ED Triage Vitals  Enc Vitals Group     BP 01/14/22 1143 (!) 155/92     Pulse Rate 01/14/22 1143 69     Resp 01/14/22 1143 18     Temp 01/14/22 1143 97.8 F (36.6 C)     Temp Source 01/14/22 1143 Oral     SpO2 01/14/22 1143 98 %     Weight 01/14/22 1139 175 lb (79.4 kg)     Height  01/14/22 1139 4\' 11"  (1.499 m)     Head Circumference --      Peak Flow --      Pain Score 01/14/22 1138 10     Pain Loc --      Pain Edu? --      Excl. in [place]? --    No data found.  Updated Vital Signs BP (!) 155/92 (BP Location: Left Arm)   Pulse 69   Temp 97.8 F (36.6 C) (Oral)   Resp 18   Ht 4\' 11"  (1.499 m)   Wt 79.4 kg   LMP  (LMP Unknown)   SpO2 98%   BMI 35.35 kg/m   Visual Acuity Right Eye Distance:   Left Eye Distance:   Bilateral Distance:    Right Eye Near:   Left Eye Near:    Bilateral Near:     Physical Exam  GEN: alert, well appearing female, in no acute distress ***   HENT:  mucus membranes moist, oropharyngeal without lesions or erythema ***,  nares patent, no nasal discharge *** EYES:   pupils equal and reactive, EOM intact NECK:  supple, normal ROM, no lymphadenopathy *** RESP:  clear to auscultation bilaterally, no increased work of breathing *** CVS:   regular rate and rhythm, no murmur, distal pulses intact  *** ABD:  soft, non-tender; bowel sounds present; no palpable masses,  *** GU:  normal female/female *** EXT:   normal ROM, atraumatic, no edema *** NEURO:  normal without focal findings,  speech normal, alert and oriented  *** Skin:   warm and dry, no rash***, normal*** skin turgor Psych: Normal affect, appropriate speech and behavior    UC Treatments / Results  Labs (all labs ordered are listed, but only abnormal results are displayed) Labs Reviewed  CBC WITH DIFFERENTIAL/PLATELET - Abnormal; Notable for the following components:      Result Value   Abs Immature Granulocytes 0.08 (*)    All other components within normal limits  COMPREHENSIVE METABOLIC PANEL - Abnormal; Notable for the following components:   Potassium 3.3 (*)    Glucose, Bld 101 (*)    Total Protein 8.2 (*)    All other components within normal limits    EKG   Radiology No results found.  Procedures Procedures (including critical care  time)  Medications Ordered in UC Medications  dexamethasone (DECADRON) injection 10 mg (10 mg Intramuscular Given 01/14/22 1242)    Initial Impression / Assessment and Plan / UC Course  I have reviewed the triage vital signs and the nursing notes.  Pertinent labs &  imaging results that were available during my care of the patient were reviewed by me and considered in my medical decision making (see chart for details).     *** Final Clinical Impressions(s) / UC Diagnoses   Final diagnoses:  Left hip pain  Anticoagulant long-term use  Hypothyroidism, unspecified type     Discharge Instructions      Were set up for primary care provider appointment today.    Start the 125 mcg levothyroxine, you will need to get your thyroid hormone rechecked on December 1.  You can call your primary care doctor to get this scheduled.  You were given a steroid injection in your left hip to help with the pain.  Continue taking your other pain medications as prescribed. Do not stop gabapentin, you will need to be tapered off this medication.    ED Prescriptions   None    PDMP not reviewed this encounter.

## 2022-01-20 ENCOUNTER — Ambulatory Visit
Admission: EM | Admit: 2022-01-20 | Discharge: 2022-01-20 | Disposition: A | Discharge status: Home / Self Care | Payer: No Typology Code available for payment source | Attending: Urgent Care | Admitting: Urgent Care

## 2022-01-20 DIAGNOSIS — E038 Other specified hypothyroidism: Secondary | ICD-10-CM

## 2022-01-20 DIAGNOSIS — M5136 Other intervertebral disc degeneration, lumbar region: Secondary | ICD-10-CM | POA: Diagnosis not present

## 2022-01-20 DIAGNOSIS — M47819 Spondylosis without myelopathy or radiculopathy, site unspecified: Secondary | ICD-10-CM

## 2022-01-20 MED ORDER — TRAMADOL HCL 50 MG PO TABS: 50.0000 mg | ORAL_TABLET | Freq: Two times a day (BID) | ORAL | 0 refills | Status: AC | PRN

## 2022-01-20 MED ORDER — PREDNISONE 10 MG PO TABS: 20.0000 mg | ORAL_TABLET | Freq: Every day | ORAL | 0 refills | Status: AC

## 2022-01-20 NOTE — Discharge Instructions (Addendum)
You have been prescribed oral prednisone, please take 2 tablets daily until gone.  I do not recommend another injection given your recent IM injection. You would likely benefit from direct injection into the spine, therefore please follow-up with [facility].  Contact information attached to this form. You may use the tramadol on an as-needed basis, do not exceed 2 tablets in 24 hours.  This may make you drowsy or constipated.  This medication is not indicated for long-term use. Physical therapy is highly recommended, please also discuss this with the spine specialist.  You purchase over-the-counter Salonpas, which is a lidocaine patch.  This may provide you some relief.  You may also purchase an over-the-counter TENS unit. You must follow-up with a PCP.  Dr. Larry Sierras information is attached to this form, please call her today to schedule new patient appointment.  You need to have your lab work including thyroid rechecked within the next month.

## 2022-01-20 NOTE — ED Triage Notes (Addendum)
Pt c/o LT leg pain starts at LT hip & radiates into LT foot, pt states this pain has been worsening.  Pt also c/o lower back pain, pt states this pain has been going on "forever" but worsening as well. Pt reports having pain when walking. Pt denies any fall or injury.  Pt also requesting refill on tramadol. Pt states she has one pill left for tonight & states this did help with her pain

## 2022-01-20 NOTE — ED Provider Notes (Signed)
[facility]    CSN: [id number] Arrival date & time: 01/20/22  0909      History   Chief Complaint Chief Complaint  Patient presents with   Back Pain    Lower back   Leg Pain    LT Leg    HPI Erica Edwards is a 81 y.o. female.   81 year old female presents today due to concerns of continued back and leg pain.  She has had this for quite some time.  It appears that she saw [facility] back in June with complaint of chronic back pain and left-sided sciatica.  At that point it was recommended she see pain management and physical therapy.  She refused both at that time and canceled her PT appointment.  She states she lives by herself, and has difficulty driving.  She refuses to drive to [place], or anywhere outside of [place].  She previously was seeing the [place], but states they do not have enough handicap parking, and she cannot walk from the parking lot to her appointments, thus has not gone in "quite some time."  Last time she was here, roughly 10 days ago, she had numerous x-rays performed.  Her L-spine x-rays showed significant degenerative disc disease, severe facet arthropathy.  She had a steroid injection.  She states it was minimally effective.  She came in today primarily requesting a refill of her tramadol as she states it was helpful, and denied side effects.  She also is requesting an injection in her back because "her friends had this and it was helpful".  She states that her current pain is the same she has felt chronically for years.  She denies any new symptoms, most specifically no saddle anesthesia or change in bowel or bladder habits.  She cannot take NSAIDs due to her Eliquis. Additionally, last visit she had a TSH drawn.  It was >14, and thus her Synthroid dose was increased to 125 mcg.  Patient states she is feeling better on the increased dose.  She is in need of a PCP locally to have this monitored.   Back Pain Associated symptoms: leg pain   Leg  Pain Associated symptoms: back pain     Past Medical History:  Diagnosis Date   Atrial fibrillation (HCC)    Depression    Osteoarthritis    Vertigo     There are no problems to display for this patient.   Past Surgical History:  Procedure Laterality Date   ABDOMINAL HYSTERECTOMY      OB History   No obstetric history on file.      Home Medications    Prior to Admission medications   Medication Sig Start Date End Date Taking? Authorizing Provider  predniSONE (DELTASONE) 10 MG tablet Take 2 tablets (20 mg total) by mouth daily for 5 days. 01/20/22 01/25/22 Yes Nishant Schrecengost L, PA  traMADol (ULTRAM) 50 MG tablet Take 1 tablet (50 mg total) by mouth every 12 (twelve) hours as needed for up to 3 days for severe pain. 01/20/22 01/23/22 Yes Shenita Trego L, PA  amLODipine (NORVASC) 10 MG tablet TAKE ONE-HALF TABLET BY MOUTH EVERY DAY FOR BLOOD PRESSURE 04/02/21   [provider]  apixaban (ELIQUIS) 5 MG TABS tablet TAKE ONE TABLET BY MOUTH EVERY 12 HOURS - CAUTION BLOOD THINNER 05/31/20   [provider]  gabapentin (NEURONTIN) 300 MG capsule TAKE TWO CAPSULES BY MOUTH THREE TIMES A DAY FOR NERVE PAIN *MAY CAUSE DROWSINESS* 04/02/21   [provider]  ipratropium (ATROVENT) 0.06 % nasal spray Place 2 sprays into both nostrils 4 (four) times daily. 10/04/21   Margarette Canada, NP  levothyroxine (SYNTHROID) 125 MCG tablet Take 1 tablet (125 mcg total) by mouth daily before breakfast. 01/13/22 02/12/22  Lamptey, Myrene Galas, MD  lisinopril (ZESTRIL) 40 MG tablet TAKE ONE-HALF TABLET BY MOUTH EVERY DAY FOR BLOOD PRESSURE 04/02/21   [provider]  ondansetron (ZOFRAN) 4 MG tablet Take 1 tablet (4 mg total) by mouth every 6 (six) hours. 10/11/21   Vevelyn Francois, NP  trospium (SANCTURA) 20 MG tablet Take by mouth.    [provider]    Family History History reviewed. No pertinent family history.  Social History Social History   Tobacco Use    Smoking status: Never   Smokeless tobacco: Never  Vaping Use   Vaping Use: Never used  Substance Use Topics   Alcohol use: Never   Drug use: Never     Allergies   Azithromycin, Gabapentin, Naproxen, Nortriptyline, Sertraline, Venlafaxine, Penicillin g, and Streptomycin   Review of Systems Review of Systems  Musculoskeletal:  Positive for back pain.  As per HPI   Physical Exam Triage Vital Signs ED Triage Vitals  Enc Vitals Group     BP 01/20/22 0934 133/69     Pulse Rate 01/20/22 0934 79     Resp --      Temp 01/20/22 0934 98.5 F (36.9 C)     Temp Source 01/20/22 0934 Oral     SpO2 01/20/22 0934 95 %     Weight 01/20/22 0932 175 lb (79.4 kg)     Height 01/20/22 0932 4\' 11"  (1.499 m)     Head Circumference --      Peak Flow --      Pain Score 01/20/22 0932 8     Pain Loc --      Pain Edu? --      Excl. in [place]? --    No data found.  Updated Vital Signs BP 133/69 (BP Location: Left Arm)   Pulse 79   Temp 98.5 F (36.9 C) (Oral)   Ht 4\' 11"  (1.499 m)   Wt 175 lb (79.4 kg)   LMP  (LMP Unknown)   SpO2 95%   BMI 35.35 kg/m   Visual Acuity Right Eye Distance:   Left Eye Distance:   Bilateral Distance:    Right Eye Near:   Left Eye Near:    Bilateral Near:     Physical Exam GEN: alert, chronically ill appearing elderly female, in no acute distress. Using cane for ambulation      HENT:  mucus membranes moist,  no nasal discharge, hair thinning present with visible inspection  EYES:   pupils equal and reactive, EOM intact NECK:  supple, normal ROM, no appreciable goiter RESP:  clear to auscultation bilaterally, no increased work of breathing   CVS:     regular rate and rhythm EXT:     Left hip: using walking cane, decreased abduction due to pain, tender to parasacral muscles, no SI joint tenderness. OA to bilateral hands. Pt able to stand on tiptoes and heels. NEURO:  normal without focal findings,  speech normal, alert and oriented    Skin:    warm and  dry Psych: Normal affect, appropriate speech and behavior  UC Treatments / Results  Labs (all labs ordered are listed, but only abnormal results are displayed) Labs Reviewed - No data to display  EKG  Radiology No results found.  Procedures Procedures (including critical care time)  Medications Ordered in UC Medications - No data to display  Initial Impression / Assessment and Plan / UC Course  I have reviewed the triage vital signs and the nursing notes.  Pertinent labs & imaging results that were available during my care of the patient were reviewed by me and considered in my medical decision making (see chart for details).     DDD - pt with chronic arthritis. Tx options have been reviewed with patient numerous times, by numerous specialties.  She tells me today that unless it is in local [place], she will not go to the appointment.  She also refuses physical therapy.  She cannot take NSAIDs due to her Eliquis.  I did review PDMP, has only had 2 fills of tramadol in the past year.  Patient at low risk of abuse.  I did however discussed with patient that this is not an appropriate long-term alternative, discussed the side effects of constipation and sedation with her.  Encouraged her to follow-up with a spine specialist to consider possible injections in addition to TENS units and other modalities that may provide more benefit.  Patient was requesting an additional steroid injection today, however had one 10 days ago, and did not receive much benefit from it. Severe facet arthropathy -as above.  We will try short course of oral prednisone as patient is not candidate for another IM injection at present time.  Cannot tolerate NSAIDs.  Already on gabapentin.  Will need specialist evaluation.  Again, patient refuses physical therapy. Hypothyroid -most recent TSH greater than 14.  She was increased to 125 mcg of Synthroid.  Information given for patient to find a new PCP, discussed that we  are in urgent care and do not monitor this type of condition chronically   Final Clinical Impressions(s) / UC Diagnoses   Final diagnoses:  Degenerative disc disease, lumbar  Facet arthropathy of spine  Other specified hypothyroidism     Discharge Instructions      You have been prescribed oral prednisone, please take 2 tablets daily until gone.  I do not recommend another injection given your recent IM injection. You would likely benefit from direct injection into the spine, therefore please follow-up with Kerri Perches.  Contact information attached to this form. You may use the tramadol on an as-needed basis, do not exceed 2 tablets in 24 hours.  This may make you drowsy or constipated.  This medication is not indicated for long-term use. Physical therapy is highly recommended, please also discuss this with the spine specialist.  You purchase over-the-counter Salonpas, which is a lidocaine patch.  This may provide you some relief.  You may also purchase an over-the-counter TENS unit. You must follow-up with a PCP.  Dr. Elonda Husky information is attached to this form, please call her today to schedule new patient appointment.  You need to have your lab work including thyroid rechecked within the next month.    ED Prescriptions     Medication Sig Dispense Auth. Provider   predniSONE (DELTASONE) 10 MG tablet Take 2 tablets (20 mg total) by mouth daily for 5 days. 10 tablet Jarryn Altland L, PA   traMADol (ULTRAM) 50 MG tablet Take 1 tablet (50 mg total) by mouth every 12 (twelve) hours as needed for up to 3 days for severe pain. 6 tablet Julionna Marczak L, [place]      I have reviewed the PDMP during this encounter.  Maretta Bees, [place] 01/20/22 [phone]

## 2022-02-02 ENCOUNTER — Ambulatory Visit
Admission: EM | Admit: 2022-02-02 | Discharge: 2022-02-02 | Disposition: A | Discharge status: Home / Self Care | Payer: No Typology Code available for payment source | Attending: Family Medicine | Admitting: Family Medicine

## 2022-02-02 ENCOUNTER — Encounter: Payer: Self-pay | Admitting: Emergency Medicine

## 2022-02-02 DIAGNOSIS — M5442 Lumbago with sciatica, left side: Secondary | ICD-10-CM | POA: Diagnosis not present

## 2022-02-02 DIAGNOSIS — M5441 Lumbago with sciatica, right side: Secondary | ICD-10-CM | POA: Diagnosis not present

## 2022-02-02 DIAGNOSIS — M159 Polyosteoarthritis, unspecified: Secondary | ICD-10-CM

## 2022-02-02 DIAGNOSIS — G8929 Other chronic pain: Secondary | ICD-10-CM | POA: Diagnosis not present

## 2022-02-02 MED ORDER — PREDNISONE 10 MG PO TABS: ORAL_TABLET | ORAL | 0 refills | Status: DC

## 2022-02-02 MED ORDER — TRAMADOL HCL 50 MG PO TABS: 50.0000 mg | ORAL_TABLET | Freq: Three times a day (TID) | ORAL | 0 refills | Status: DC | PRN

## 2022-02-02 NOTE — Discharge Instructions (Signed)
Medications as prescribed.  Please call [facility] clinic Orthopedics [phone]) OR [facility] [phone]) for an appt.  Take care  Dr. Adriana Simas

## 2022-02-02 NOTE — ED Triage Notes (Signed)
Patient c/o pain in her left leg for a week.  Patient denies injury or fall.  Patient states that her pain has gotten worse today.

## 2022-02-02 NOTE — ED Provider Notes (Signed)
[facility]    CSN: [id number] Arrival date & time: 02/02/22  1419      History   Chief Complaint Chief Complaint  Patient presents with   Leg Pain    left    HPI 81 year old female with atrial fibrillation, depression, osteoarthritis presents with complaints of leg pain.  Patient has known osteoarthritis of the hips as well as the knees.  She also has degenerative disc disease and facet arthropathy.  She is experiencing pain of both of her lower extremities from the hips down.  Also has low back pain.  This is likely multifactorial due to osteoarthritis of the hips and knees as well as pain radiating from the back.  She is currently taking tramadol and wants more this medication.  She is also requesting additional prednisone.  She was previously treated with this with good results.  She is trying to establish care in our area.  Home Medications    Prior to Admission medications   Medication Sig Start Date End Date Taking? Authorizing Provider  predniSONE (DELTASONE) 10 MG tablet 50 mg daily x 2 days, then 40 mg daily x 2 days, then 30 mg daily x 2 days, then 20 mg daily x 2 days, then 10 mg daily x 2 days. 02/02/22  Yes Elisha Mcgruder G, DO  traMADol (ULTRAM) 50 MG tablet Take 1-2 tablets (50-100 mg total) by mouth every 8 (eight) hours as needed. 02/02/22  Yes Alann Avey G, DO  amLODipine (NORVASC) 10 MG tablet TAKE ONE-HALF TABLET BY MOUTH EVERY DAY FOR BLOOD PRESSURE 04/02/21   [provider]  apixaban (ELIQUIS) 5 MG TABS tablet TAKE ONE TABLET BY MOUTH EVERY 12 HOURS - CAUTION BLOOD THINNER 05/31/20   [provider]  gabapentin (NEURONTIN) 300 MG capsule TAKE TWO CAPSULES BY MOUTH THREE TIMES A DAY FOR NERVE PAIN *MAY CAUSE DROWSINESS* 04/02/21   [provider]  ipratropium (ATROVENT) 0.06 % nasal spray Place 2 sprays into both nostrils 4 (four) times daily. 10/04/21   Becky Augusta, NP  levothyroxine (SYNTHROID) 125 MCG tablet Take 1 tablet  (125 mcg total) by mouth daily before breakfast. 01/13/22 02/12/22  Lamptey, Britta Mccreedy, MD  lisinopril (ZESTRIL) 40 MG tablet TAKE ONE-HALF TABLET BY MOUTH EVERY DAY FOR BLOOD PRESSURE 04/02/21   [provider]  ondansetron (ZOFRAN) 4 MG tablet Take 1 tablet (4 mg total) by mouth every 6 (six) hours. 10/11/21   Barbette Merino, NP  trospium (SANCTURA) 20 MG tablet Take by mouth.    [provider]    Family History History reviewed. No pertinent family history.  Social History Social History   Tobacco Use   Smoking status: Never   Smokeless tobacco: Never  Vaping Use   Vaping Use: Never used  Substance Use Topics   Alcohol use: Never   Drug use: Never     Allergies   Azithromycin, Gabapentin, Naproxen, Nortriptyline, Sertraline, Venlafaxine, Penicillin g, and Streptomycin   Review of Systems Review of Systems Per HPI  Physical Exam Triage Vital Signs ED Triage Vitals  Enc Vitals Group     BP 02/02/22 1446 134/79     Pulse Rate 02/02/22 1446 80     Resp 02/02/22 1446 14     Temp 02/02/22 1446 98.8 F (37.1 C)     Temp Source 02/02/22 1446 Oral     SpO2 02/02/22 1446 96 %     Weight 02/02/22 1445 175 lb (79.4 kg)  Height 02/02/22 1445 4\' 11"  (1.499 m)     Head Circumference --      Peak Flow --      Pain Score 02/02/22 1445 10     Pain Loc --      Pain Edu? --      Excl. in [place]? --    No data found.  Updated Vital Signs BP 134/79 (BP Location: Left Arm)   Pulse 80   Temp 98.8 F (37.1 C) (Oral)   Resp 14   Ht 4\' 11"  (1.499 m)   Wt 79.4 kg   LMP  (LMP Unknown)   SpO2 96%   BMI 35.35 kg/m   Visual Acuity Right Eye Distance:   Left Eye Distance:   Bilateral Distance:    Right Eye Near:   Left Eye Near:    Bilateral Near:     Physical Exam Constitutional:      Appearance: She is obese.  HENT:     Head: Normocephalic and atraumatic.  Eyes:     General:        Right eye: No discharge.        Left eye: No discharge.      Conjunctiva/sclera: Conjunctivae normal.  Pulmonary:     Effort: Pulmonary effort is normal.     Breath sounds: Normal breath sounds.  Musculoskeletal:     Comments: Decreased range of motion lumbar spine, knees and hips.  Neurological:     Mental Status: She is alert.      UC Treatments / Results  Labs (all labs ordered are listed, but only abnormal results are displayed) Labs Reviewed - No data to display  EKG   Radiology No results found.  Procedures Procedures (including critical care time)  Medications Ordered in UC Medications - No data to display  Initial Impression / Assessment and Plan / UC Course  I have reviewed the triage vital signs and the nursing notes.  Pertinent labs & imaging results that were available during my care of the patient were reviewed by me and considered in my medical decision making (see chart for details).    81 year old female presenting with chronic pain from underlying degenerative changes.  Information given regarding local orthopedist.  Tramadol as directed.  Prednisone as directed as well.  Final Clinical Impressions(s) / UC Diagnoses   Final diagnoses:  Chronic bilateral low back pain with bilateral sciatica  Primary osteoarthritis involving multiple joints     Discharge Instructions      Medications as prescribed.  Please call [facility] Orthopedics [phone]) OR [facility] [phone]) for an appt.  Take care  Dr. ([phone]    ED Prescriptions     Medication Sig Dispense Auth. Provider   traMADol (ULTRAM) 50 MG tablet Take 1-2 tablets (50-100 mg total) by mouth every 8 (eight) hours as needed. 30 tablet Laketta Soderberg G, DO   predniSONE (DELTASONE) 10 MG tablet 50 mg daily x 2 days, then 40 mg daily x 2 days, then 30 mg daily x 2 days, then 20 mg daily x 2 days, then 10 mg daily x 2 days. 30 tablet 04-28-2003 G, DO      I have reviewed the PDMP during this encounter.   05-23-1983, Everlene Other 02/02/22  1639

## 2022-02-21 ENCOUNTER — Encounter: Payer: Self-pay | Admitting: Family Medicine

## 2022-02-21 ENCOUNTER — Ambulatory Visit (INDEPENDENT_AMBULATORY_CARE_PROVIDER_SITE_OTHER): Payer: No Typology Code available for payment source | Admitting: Family Medicine

## 2022-02-21 VITALS — BP 128/78 | HR 72 | Ht 59.0 in | Wt 175.0 lb

## 2022-02-21 DIAGNOSIS — M47817 Spondylosis without myelopathy or radiculopathy, lumbosacral region: Secondary | ICD-10-CM

## 2022-02-21 DIAGNOSIS — M1712 Unilateral primary osteoarthritis, left knee: Secondary | ICD-10-CM

## 2022-02-21 MED ORDER — DULOXETINE HCL 30 MG PO CPEP: 30.0000 mg | ORAL_CAPSULE | Freq: Every evening | ORAL | 0 refills | Status: DC

## 2022-02-21 MED ORDER — TRIAMCINOLONE ACETONIDE 40 MG/ML IJ SUSP
40.0000 mg | Freq: Once | INTRAMUSCULAR | Status: AC
  Administered 2022-02-21: 40 mg via INTRAMUSCULAR

## 2022-02-21 NOTE — Assessment & Plan Note (Signed)
Chronic condition with longstanding symptomatology localized to the low back symmetrically without radiation, describes as tightness and stiffness, aggravated with position change, prolonged weightbearing, laying.  Examination with limited active and passive range of motion, negative straight leg raise bilaterally, positive Kemps test bilaterally, sensorimotor intact.  Radiographic features are consistent with multilevel lower lumbar facet arthropathy and her examination is without evidence of overt neural involvement.  Given her long-term anticoagulation, from medication standpoint we will initiate duloxetine, place referral for pain and spine group, and care coordination due to transportation barriers discussed by patient today.  We will follow peripherally on this issue.

## 2022-02-21 NOTE — Patient Instructions (Signed)
You have just been given a cortisone injection to reduce pain and inflammation. After the injection you may notice immediate relief of pain as a result of the Lidocaine. It is important to rest the area of the injection for 24 to 48 hours after the injection. There is a possibility of some temporary increased discomfort and swelling for up to 72 hours until the cortisone begins to work. If you do have pain, simply rest the joint and use ice. If you can tolerate over the counter medications, you can try Tylenol for added relief per package instructions. - Rest x 2 days then resume normal activity - Start Cymbalta (duloxetine) nightly - Use Tylenol, up to 1000 mg up to 3 times per day - Coordinator will contact you to schedule visit with spine group and for care coordination

## 2022-02-21 NOTE — Progress Notes (Signed)
Primary Care / Sports Medicine Office Visit  Patient Information:  Patient ID: Erica Edwards, female DOB: 11-01-1940 Age: 81 y.o. MRN: [id number]   Erica Edwards is a pleasant 81 y.o. female presenting with the following:  Chief Complaint  Patient presents with   Back Pain    Lower back   Knee Pain    L) knee is "locked"    Vitals:   02/21/22 1100  BP: 128/78  Pulse: 72  SpO2: 94%   Vitals:   02/21/22 1100  Weight: 175 lb (79.4 kg)  Height: 4\' 11"  (1.499 m)   Body mass index is 35.35 kg/m.  No results found.   Independent interpretation of notes and tests performed by another provider:   Independent interpretation of left knee x-rays dated 01/10/2022 demonstrates tricompartmental osteoarthritis with focality to the medial tibiofemoral and patellofemoral articulations, evidence of lateral joint line osteophyte formation and patellofemoral maltracking.  Independent interpretation of lumbar spine x-rays dated 01/10/2022 demonstrate multilevel lumbosacral generative changes with facet arthropathy primarily at L3-4, L4-5, L5-S1, intervertebral narrowing on multiple levels, grade 1 retrolisthesis of L1 and L2, no acute osseous processes identified.  Procedures performed:   Procedure:  Injection of left knee, anterolateral approach. Skin prepped in a sterile fashion. Ethyl chloride for topical local analgesia.  Completed without difficulty and tolerated well. Medication: triamcinolone acetonide 40 mg/mL suspension for injection 1 mL total and 2 mL lidocaine 1% without epinephrine utilized for needle placement anesthetic Advised to contact for fevers/chills, erythema, induration, drainage, or persistent bleeding.   Pertinent History, Exam, Impression, and Recommendations:   Problem List Items Addressed This Visit       Musculoskeletal and Integument   Primary osteoarthritis of left knee - Primary    Chronic condition with symptoms ongoing for years, pain  diffuse about the left knee, aggravated with ADLs.  Examination with maximal tenderness at the medial joint line, secondarily to the patella facets, maximum discomfort during passive knee flexion past 110 degrees, limited by pain, no mechanical obstruction, no laxity with anterior/posterior and valgus/varus stressing, McMurray's testing benign.  She has had x-rays that were reviewed with patient and given her clinical features, findings are most consistent with left knee osteoarthritis related arthralgia with focality to the patellofemoral and medial tibiofemoral articulations.  We discussed treatment strategies given her relative medication contraindications due to chronic anticoagulation therapy.  She did elect to proceed with intra-articular left knee corticosteroid injection, additionally will initiate duloxetine 30 mg daily to be continued by her PCP if beneficial after 95-month course.  She can otherwise follow-up as needed.      Relevant Medications   acetaminophen (TYLENOL) 325 MG tablet   DULoxetine (CYMBALTA) 30 MG capsule   Other Relevant Orders   AMB Referral to Community Care Coordinaton ([facility]O Patients)   Spondylosis of lumbosacral region without myelopathy or radiculopathy    Chronic condition with longstanding symptomatology localized to the low back symmetrically without radiation, describes as tightness and stiffness, aggravated with position change, prolonged weightbearing, laying.  Examination with limited active and passive range of motion, negative straight leg raise bilaterally, positive Kemps test bilaterally, sensorimotor intact.  Radiographic features are consistent with multilevel lower lumbar facet arthropathy and her examination is without evidence of overt neural involvement.  Given her long-term anticoagulation, from medication standpoint we will initiate duloxetine, place referral for pain and spine group, and care coordination due to transportation barriers discussed by  patient today.  We will follow peripherally on this  issue.      Relevant Medications   acetaminophen (TYLENOL) 325 MG tablet   DULoxetine (CYMBALTA) 30 MG capsule   Other Relevant Orders   Ambulatory referral to Pain Clinic   AMB Referral to Community Care Coordinaton ([facility]O Patients)     Orders & Medications Meds ordered this encounter  Medications   DULoxetine (CYMBALTA) 30 MG capsule    Sig: Take 1 capsule (30 mg total) by mouth at bedtime.    Dispense:  90 capsule    Refill:  0   triamcinolone acetonide (KENALOG-40) injection 40 mg   Orders Placed This Encounter  Procedures   Ambulatory referral to Pain Clinic   AMB Referral to Community Care Coordinaton (ACO Patients)     No follow-ups on file.     Jerrol Banana, MD, [facility]   Primary Care Sports Medicine Primary Care and Sports Medicine at [facility]

## 2022-02-21 NOTE — Assessment & Plan Note (Signed)
Chronic condition with symptoms ongoing for years, pain diffuse about the left knee, aggravated with ADLs.  Examination with maximal tenderness at the medial joint line, secondarily to the patella facets, maximum discomfort during passive knee flexion past 110 degrees, limited by pain, no mechanical obstruction, no laxity with anterior/posterior and valgus/varus stressing, McMurray's testing benign.  She has had x-rays that were reviewed with patient and given her clinical features, findings are most consistent with left knee osteoarthritis related arthralgia with focality to the patellofemoral and medial tibiofemoral articulations.  We discussed treatment strategies given her relative medication contraindications due to chronic anticoagulation therapy.  She did elect to proceed with intra-articular left knee corticosteroid injection, additionally will initiate duloxetine 30 mg daily to be continued by her PCP if beneficial after 77-month course.  She can otherwise follow-up as needed.

## 2022-02-24 ENCOUNTER — Telehealth: Payer: Self-pay | Admitting: *Deleted

## 2022-02-24 NOTE — Progress Notes (Signed)
  Care Coordination   Note   02/24/2022 Name: Erica Edwards MRN: [id number] DOB: [birth year]  Erica Edwards is a 81 y.o. year old female who sees Guiteau-Laurent, Angelique D, NP for primary care. I reached out to Erica Edwards by phone today to offer care coordination services.  Ms. Esteve was given information about Care Coordination services today including:   The Care Coordination services include support from the care team which includes your Nurse Coordinator, Clinical Social Worker, or Pharmacist.  The Care Coordination team is here to help remove barriers to the health concerns and goals most important to you. Care Coordination services are voluntary, and the patient may decline or stop services at any time by request to their care team member.   Care Coordination Consent Status: Patient did not agree to participate in care coordination services at this time.  Follow up plan:  Pt declined transportation social work needs at this time - contact info given if future needs arise   Encounter Outcome:  Pt. Refused  Burman Nieves, CCMA Care Coordination Care Guide Direct Dial: [phone]

## 2022-05-08 ENCOUNTER — Ambulatory Visit
Admission: EM | Admit: 2022-05-08 | Discharge: 2022-05-08 | Disposition: A | Discharge status: Home / Self Care | Payer: No Typology Code available for payment source | Attending: Family Medicine | Admitting: Family Medicine

## 2022-05-08 ENCOUNTER — Other Ambulatory Visit: Payer: Self-pay

## 2022-05-08 ENCOUNTER — Ambulatory Visit (INDEPENDENT_AMBULATORY_CARE_PROVIDER_SITE_OTHER): Payer: No Typology Code available for payment source

## 2022-05-08 ENCOUNTER — Ambulatory Visit: Payer: No Typology Code available for payment source

## 2022-05-08 DIAGNOSIS — G8929 Other chronic pain: Secondary | ICD-10-CM

## 2022-05-08 DIAGNOSIS — M25561 Pain in right knee: Secondary | ICD-10-CM

## 2022-05-08 DIAGNOSIS — M25562 Pain in left knee: Secondary | ICD-10-CM

## 2022-05-08 DIAGNOSIS — Z758 Other problems related to medical facilities and other health care: Secondary | ICD-10-CM

## 2022-05-08 HISTORY — DX: Essential (primary) hypertension: I10

## 2022-05-08 MED ORDER — PREDNISONE 50 MG PO TABS: 50.0000 mg | ORAL_TABLET | Freq: Every day | ORAL | 0 refills | Status: AC

## 2022-05-08 MED ORDER — TRAMADOL HCL 50 MG PO TABS: 50.0000 mg | ORAL_TABLET | Freq: Two times a day (BID) | ORAL | 0 refills | Status: DC | PRN

## 2022-05-08 NOTE — ED Triage Notes (Signed)
Pt c/o bilat knee pain with burning and grinding sensation. Currently 0/10 pain but pain is a 10 when it hurts and is intermittent.

## 2022-05-08 NOTE — ED Provider Notes (Signed)
[facility]    CSN: [id number] Arrival date & time: 05/08/22  0827      History   Chief Complaint Chief Complaint  Patient presents with   Knee Pain    Pt c/o bilat knee pain with grinding senation    HPI  HPI Erica Edwards is a 82 y.o. female.   Erica Edwards presents for bilateral knee pain and feels like her legs are locking up on her. Pain is intermittent for the past 3 weeks. Pain radiates from the knees to the thigh.  She has "meat tearing grinding pain."  She doesn't have an orthopedic doctor. She use to follow with the [facility] and she no longer can take the long walk from the garage.   She took OTC arthritis pain medication with minimal relief. She last took meds for pain yesterday. She was previously told she needs a right knee replacement.  No falls or recent trauma. Has known arthritis. No abnormal pops or sounds heard.     Past Medical History:  Diagnosis Date   Atrial fibrillation ([place])    Depression    Hypertension    Osteoarthritis    Vertigo     Patient Active Problem List   Diagnosis Date Noted   Primary osteoarthritis of left knee 02/21/2022   Spondylosis of lumbosacral region without myelopathy or radiculopathy 02/21/2022    Past Surgical History:  Procedure Laterality Date   ABDOMINAL HYSTERECTOMY      OB History   No obstetric history on file.      Home Medications    Prior to Admission medications   Medication Sig Start Date End Date Taking? Authorizing Provider  predniSONE (DELTASONE) 50 MG tablet Take 1 tablet (50 mg total) by mouth daily for 5 days. 05/08/22 05/13/22 Yes Horald Birky, Ronnette Juniper, DO  acetaminophen (TYLENOL) 325 MG tablet TAKE  BY MOUTH 08/26/21   [provider]  amLODipine (NORVASC) 10 MG tablet TAKE ONE-HALF TABLET BY MOUTH EVERY DAY FOR BLOOD PRESSURE 04/02/21   [provider]  apixaban (ELIQUIS) 5 MG TABS tablet TAKE ONE TABLET BY MOUTH EVERY 12 HOURS - CAUTION BLOOD THINNER 05/31/20   [provider]  diclofenac Sodium (VOLTAREN) 1 % GEL APPLY 4 GRAMS TOPICALLY TWO TIMES A DAY AS NEEDED PUT ON KNEES FOR PAIN AS DIRECTED. USE GLOVES TO APPLY. 04/02/21   [provider]  gabapentin (NEURONTIN) 300 MG capsule TAKE TWO CAPSULES BY MOUTH THREE TIMES A DAY FOR NERVE PAIN *MAY CAUSE DROWSINESS* 04/02/21   [provider]  ipratropium (ATROVENT) 0.06 % nasal spray Place 2 sprays into both nostrils 4 (four) times daily. 10/04/21   Margarette Canada, NP  levothyroxine (SYNTHROID) 125 MCG tablet Take 1 tablet (125 mcg total) by mouth daily before breakfast. 01/13/22 02/21/22  Lamptey, Myrene Galas, MD  lisinopril (ZESTRIL) 40 MG tablet TAKE ONE-HALF TABLET BY MOUTH EVERY DAY FOR BLOOD PRESSURE 04/02/21   [provider]  ondansetron (ZOFRAN) 4 MG tablet Take 1 tablet (4 mg total) by mouth every 6 (six) hours. Patient not taking: Reported on 02/21/2022 10/11/21   Vevelyn Francois, NP  traMADol (ULTRAM) 50 MG tablet Take 1-2 tablets (50-100 mg total) by mouth every 12 (twelve) hours as needed. 05/08/22   Lyndee Hensen, DO  trospium (SANCTURA) 20 MG tablet Take by mouth.    [provider]    Family History No family history on file.  Social History Social History   Tobacco Use   Smoking status: Never  Smokeless tobacco: Never  Vaping Use   Vaping Use: Never used  Substance Use Topics   Alcohol use: Never   Drug use: Never     Allergies   Azithromycin, Gabapentin, Naproxen, Nortriptyline, Sertraline, Venlafaxine, Penicillin g, and Streptomycin   Review of Systems Review of Systems: :negative unless otherwise stated in HPI.      Physical Exam Triage Vital Signs ED Triage Vitals  Enc Vitals Group     BP      Pulse      Resp      Temp      Temp src      SpO2      Weight      Height      Head Circumference      Peak Flow      Pain Score      Pain Loc      Pain Edu?      Excl. in [place]?    No data found.  Updated Vital Signs BP 115/69    Pulse 79   Temp 98.2 F (36.8 C)   Resp 20   LMP  (LMP Unknown)   SpO2 99%   Visual Acuity Right Eye Distance:   Left Eye Distance:   Bilateral Distance:    Right Eye Near:   Left Eye Near:    Bilateral Near:     Physical Exam GEN: chronically ill-appearing female in no acute distress  CVS: well perfused, regular rate, symmetrical DP pulses   RESP: speaking in full sentences without pause, no respiratory distress  MSK: Bilateral Knee Exam -Inspection: no deformity, no discoloration -Palpation: medial joint line tenderness -ROM: Right > Left knee active and passive range of motion limited due to pain -Special Tests: Varus Stress: Negative; Valgus Stress: Negative; Lachman: Negative; Posterior drawer: Negative, meniscus testing not attempted due to acute pain.  -Limb neurovascularly intact, no instability noted    UC Treatments / Results  Labs (all labs ordered are listed, but only abnormal results are displayed) Labs Reviewed - No data to display  EKG   Radiology DG Knee Complete 4 Views Left  Result Date: 05/08/2022 CLINICAL DATA:  Pain EXAM: LEFT KNEE - COMPLETE 4 VIEW; RIGHT KNEE - COMPLETE 4 VIEW COMPARISON:  None Available. FINDINGS: Right knee: Prior right total knee replacement. No evidence of periprosthetic fracture or significant lucency. No dislocation. Trace joint effusion. Soft tissues are unremarkable. Left knee: No evidence of fracture, dislocation, or joint effusion. Severe medial and patellofemoral compartment degenerative changes. Moderate degenerative changes of the lateral compartment. Soft tissues are unremarkable. IMPRESSION: RIGHT KNEE: Prior right total knee replacement without evidence of complication. LEFT KNEE: Moderate to severe tricompartmental degenerative changes. Electronically Signed   By: Yetta Glassman M.D.   On: 05/08/2022 10:09   DG Knee Complete 4 Views Right  Result Date: 05/08/2022 CLINICAL DATA:  Pain EXAM: LEFT KNEE - COMPLETE 4  VIEW; RIGHT KNEE - COMPLETE 4 VIEW COMPARISON:  None Available. FINDINGS: Right knee: Prior right total knee replacement. No evidence of periprosthetic fracture or significant lucency. No dislocation. Trace joint effusion. Soft tissues are unremarkable. Left knee: No evidence of fracture, dislocation, or joint effusion. Severe medial and patellofemoral compartment degenerative changes. Moderate degenerative changes of the lateral compartment. Soft tissues are unremarkable. IMPRESSION: RIGHT KNEE: Prior right total knee replacement without evidence of complication. LEFT KNEE: Moderate to severe tricompartmental degenerative changes. Electronically Signed   By: Yetta Glassman M.D.   On: 05/08/2022 10:09  Procedures Procedures (including critical care time)  Medications Ordered in UC Medications - No data to display  Initial Impression / Assessment and Plan / UC Course  I have reviewed the triage vital signs and the nursing notes.  Pertinent labs & imaging results that were available during my care of the patient were reviewed by me and considered in my medical decision making (see chart for details).      Pt is a 82 y.o.  female with history of OA who present for acute on chronic left and right knee pain for the past 3 weeks.    On chart review, she saw Dr Zigmund Daniel for back pain and prescribed Cymbalta.  She received intraarticular knee injection in her right that helped some. Driana is not taking the Cymbalta as she believes that did not help her. She was referred to pain clinic but patient has not been seen. TOC consulted for transportation but patient declined however today pt states she is willing to   Obtained right and left plain films as pt reports grinding sensation.  Personally reviewed by me were unremarkable for fracture or dislocation. Radiologist notes trace right sided joint effusion and significant left DDD  without effusion and no soft tissue swelling.   Patient to gradually  return to normal activities, as tolerated and continue ordinary activities within the limits permitted by pain. Prescribed steroids and tramadol for pain relief as these have helped her in the past.  Tylenol PRN. Counseled patient on red flag symptoms and when to seek immediate care.   Sent message to [facility] personnel who reached out to patient previously regarding transportation as patient has difficulty driving to her appointments due to her chronic pain. She has no PCP currently and has been using the UC as her PCP. She is interested in obtaining a PCP that near her home where she doesn't have to walk far to get into the building. She may benefit from having a handicap placard, if she doesn't already have one.   Patient to follow up with orthopedic provider, if symptoms do not improve with conservative treatment.  Return and ED precautions given. Understanding voiced. Discussed MDM, treatment plan and plan for follow-up with patient who agrees with plan.   Final Clinical Impressions(s) / UC Diagnoses   Final diagnoses:  Chronic pain of both knees  Does not have primary care provider     Discharge Instructions      No fractures or dislocated bones were seen on your xray.  Stop by the pharmacy to pick up your prescriptions.   Call your social worker to discuss transportation so you can see an orthopedic provider in [place] in [place] or [place] or return to the [place].   It is important that you establish care with a primary care provider.      ED Prescriptions     Medication Sig Dispense Auth. Provider   predniSONE (DELTASONE) 50 MG tablet Take 1 tablet (50 mg total) by mouth daily for 5 days. 5 tablet Nataniel Gasper, DO   traMADol (ULTRAM) 50 MG tablet Take 1-2 tablets (50-100 mg total) by mouth every 12 (twelve) hours as needed. 15 tablet Jo Cerone, DO      I have reviewed the PDMP during this encounter.   Lyndee Hensen, DO 05/08/22 1059

## 2022-05-08 NOTE — Discharge Instructions (Addendum)
No fractures or dislocated bones were seen on your xray.  Stop by the pharmacy to pick up your prescriptions.   Call your social worker to discuss transportation so you can see an orthopedic provider in [place] in [place] or [place] or return to the [place].   It is important that you establish care with a primary care provider.

## 2022-05-08 NOTE — ED Triage Notes (Signed)
Pt c/o bilat knee pain for greater than 3 weeks. Legs are beginning to lock up on her especially when she stands

## 2022-05-12 NOTE — Progress Notes (Signed)
  Care Coordination   Note   05/12/2022 Name: Erica Edwards MRN: [id number] DOB: Jan 12, 1941  Erica Edwards is a 82 y.o. year old female who sees Guiteau-Laurent, Angelique D, NP for primary care. I reached out to Erica Edwards by phone today in response to a referral and message from provider   Follow up plan:  Telephone appointment with team member scheduled for:  05/12/2022  Encounter Outcome:  Pt. Scheduled  Julian Hy, [place] Direct Dial: [phone]

## 2022-05-13 ENCOUNTER — Telehealth: Payer: Self-pay | Admitting: *Deleted

## 2022-05-13 NOTE — Progress Notes (Signed)
  Care Coordination Note  05/13/2022 Name: Erica Edwards MRN: [id number] DOB: Aug 06, 1940  Erica Edwards is a 82 y.o. year old female who is a primary care patient of Guiteau-Laurent, Angelique D, NP I reached out to Erica Edwards by phone today to assist with re-scheduling an initial visit with the BSW  Follow up plan: Unsuccessful telephone outreach attempt made. A HIPAA compliant phone message was left for the patient providing contact information and requesting a return call.  [place]  Direct Dial: [phone]

## 2022-05-19 NOTE — Progress Notes (Signed)
  Care Coordination Note  05/19/2022 Name: Erica Edwards MRN: [id number] DOB: May 12, 1940  Erica Edwards is a 82 y.o. year old female who is a primary care patient of Guiteau-Laurent, Angelique D, NP and is actively engaged with the care management team. I reached out to Erica Edwards by phone today to assist with re-scheduling an initial visit with the BSW  Follow up plan: Telephone appointment with care management team member scheduled for:05/23/22  [place]: [phone]

## 2022-05-23 ENCOUNTER — Telehealth: Payer: Self-pay

## 2022-05-23 NOTE — Patient Outreach (Signed)
  Care Coordination   05/23/2022 Name: Erica Edwards MRN: [id number] DOB: [birth year]   Care Coordination Outreach Attempts:  An unsuccessful telephone outreach was attempted today to offer the patient information about available care coordination services as a benefit of their health plan.   Follow Up Plan:  Additional outreach attempts will be made to offer the patient care coordination information and services.   Encounter Outcome:  No Answer   Care Coordination Interventions:  No, not indicated    Daneen Schick, BSW, CDP Social Worker, Certified Dementia Practitioner [facility]  Care Coordination [phone]

## 2022-06-05 ENCOUNTER — Ambulatory Visit: Payer: Self-pay

## 2022-06-05 NOTE — Patient Instructions (Signed)
Visit Information  Thank you for taking time to visit with me today. Please don't hesitate to contact me if I can be of assistance to you.   Following are the goals we discussed today:   Goals Addressed             This Visit's Progress    COMPLETED: Care Coordination Activities       Care Coordination Interventions: Referral received indicating patient is in need of assistance with transportation Discussed the patients daughter assists with local transportation but is unable to transport the patient to the [place] in [place] due to the long walk associated with parking Education provided on [employer] that will transport patient from [place] to appointments at the [facility] the patient information for review on how to schedule future trips Advised the patient to contact [place] program directly with questions and to schedule any future trips at [phone]         If you are experiencing a Mental Health or [place] or need someone to talk to, please call 911  Patient verbalizes understanding of instructions and care plan provided today and agrees to view in [place]. Active MyChart status and patient understanding of how to access instructions and care plan via MyChart confirmed with patient.     No further follow up required: Please contact your primary care provider as needed.  Daneen Schick, BSW, CDP Social Worker, Certified Dementia Practitioner [place] [facility]  Care Coordination [phone]

## 2022-06-05 NOTE — Patient Outreach (Signed)
  Care Coordination   Initial Visit Note   06/05/2022 Name: Erica Edwards MRN: [id number] DOB: 03-28-1940  Erica Edwards is a 82 y.o. year old female who sees Guiteau-Laurent, Angelique D, NP for primary care. I spoke with  Erica Edwards by phone today.  What matters to the patients health and wellness today?  Patient needs transportation resources to get to the [facility] in [place]             This Visit's Progress    COMPLETED: Care Coordination Activities       Care Coordination Interventions: Referral received indicating patient is in need of assistance with transportation Discussed the patients daughter assists with local transportation but is unable to transport the patient to the [facility] in [place] due to the long walk associated with parking Education provided on [employer] that will transport patient from [place] to appointments at the [facility] the patient information for review on how to schedule future trips Advised the patient to contact [place] program directly with questions and to schedule any future trips at [phone] [phone]         SDOH assessments and interventions completed:  Yes  SDOH Interventions Today    Flowsheet Row Most Recent Value  SDOH Interventions   Transportation Interventions Other (Comment)  [pt uses daughter for local transportation. Unable to get to [facility]. Referred to [facility]]        Care Coordination Interventions:  Yes, provided   Interventions Today    Flowsheet Row Most Recent Value  Chronic Disease   Chronic disease during today's visit Other  General Interventions   General Interventions Discussed/Reviewed General Interventions Discussed, Community Resources  [[facility]]        Follow up plan: No further intervention required. Patient is not eligible for ongoing care coordination services.    Encounter Outcome:  Pt. Visit Completed   Daneen Schick, BSW, CDP Social Worker, Certified  Dementia Practitioner [place] [facility]  Care Coordination [phone]

## 2022-06-13 ENCOUNTER — Other Ambulatory Visit: Payer: Self-pay

## 2022-06-13 ENCOUNTER — Ambulatory Visit
Admission: EM | Admit: 2022-06-13 | Discharge: 2022-06-13 | Disposition: A | Discharge status: Home / Self Care | Payer: No Typology Code available for payment source | Attending: Emergency Medicine | Admitting: Emergency Medicine

## 2022-06-13 DIAGNOSIS — M79651 Pain in right thigh: Secondary | ICD-10-CM | POA: Diagnosis not present

## 2022-06-13 MED ORDER — BACLOFEN 10 MG PO TABS: 10.0000 mg | ORAL_TABLET | Freq: Three times a day (TID) | ORAL | 0 refills | Status: DC

## 2022-06-13 NOTE — ED Triage Notes (Addendum)
Pt reports her right upper leg "catching" intermittently and intermittent pain. Endorses right knee swelling. No aggravating factors to leg pain.but doesn't bother her as much when no pressure on it.  Has been going on for about 4 months. Reports right knee replacement but has not contacted her orthopedist

## 2022-06-13 NOTE — Discharge Instructions (Addendum)
As we discussed, scar tissue may be what is causing the pain in your right thigh.  Continue to use the topical Voltaren gel 4 times a day as needed.  I would have prescribed you baclofen 10 mg tablets that you can take every 8 hours.  This is to help calm down muscle irritation and it may help your symptoms.  Keep your appointment with the [facility] for next week as scheduled.

## 2022-06-13 NOTE — ED Provider Notes (Signed)
[facility]    CSN: [id number] Arrival date & time: 06/13/22  0945      History   Chief Complaint Chief Complaint  Patient presents with   Leg Pain    HPI Erica Edwards is a 82 y.o. female.   HPI  82 year old female with past medical history significant for atrial fibrillation, depression, hypertension, osteoarthritis, vertigo, and right knee arthroplasty presenting for evaluation of a catch in her right thigh that has been off and on for the past 4 months.  No associated injury though it is on the same side where she had a total knee replacement.  Her knee replacement was performed by the [facility].  She has not contacted her orthopedist.  Past Medical History:  Diagnosis Date   Atrial fibrillation ([facility])    Depression    Hypertension    Osteoarthritis    Vertigo     Patient Active Problem List   Diagnosis Date Noted   Primary osteoarthritis of left knee 02/21/2022   Spondylosis of lumbosacral region without myelopathy or radiculopathy 02/21/2022    Past Surgical History:  Procedure Laterality Date   ABDOMINAL HYSTERECTOMY      OB History   No obstetric history on file.      Home Medications    Prior to Admission medications   Medication Sig Start Date End Date Taking? Authorizing Provider  acetaminophen (TYLENOL) 325 MG tablet TAKE  BY MOUTH 08/26/21   [provider]  amLODipine (NORVASC) 10 MG tablet TAKE ONE-HALF TABLET BY MOUTH EVERY DAY FOR BLOOD PRESSURE 04/02/21   [provider]  apixaban (ELIQUIS) 5 MG TABS tablet TAKE ONE TABLET BY MOUTH EVERY 12 HOURS - CAUTION BLOOD THINNER 05/31/20   [provider]  diclofenac Sodium (VOLTAREN) 1 % GEL APPLY 4 GRAMS TOPICALLY TWO TIMES A DAY AS NEEDED PUT ON KNEES FOR PAIN AS DIRECTED. USE GLOVES TO APPLY. 04/02/21   [provider]  gabapentin (NEURONTIN) 300 MG capsule TAKE TWO CAPSULES BY MOUTH THREE TIMES A DAY FOR NERVE PAIN *MAY CAUSE DROWSINESS* 04/02/21   [provider]  ipratropium (ATROVENT) 0.06 % nasal spray Place 2 sprays into both nostrils 4 (four) times daily. 10/04/21   Margarette Canada, NP  levothyroxine (SYNTHROID) 125 MCG tablet Take 1 tablet (125 mcg total) by mouth daily before breakfast. 01/13/22 02/21/22  Lamptey, Myrene Galas, MD  lisinopril (ZESTRIL) 40 MG tablet TAKE ONE-HALF TABLET BY MOUTH EVERY DAY FOR BLOOD PRESSURE 04/02/21   [provider]  ondansetron (ZOFRAN) 4 MG tablet Take 1 tablet (4 mg total) by mouth every 6 (six) hours. Patient not taking: Reported on 02/21/2022 10/11/21   Vevelyn Francois, NP  traMADol (ULTRAM) 50 MG tablet Take 1-2 tablets (50-100 mg total) by mouth every 12 (twelve) hours as needed. 05/08/22   Lyndee Hensen, DO  trospium (SANCTURA) 20 MG tablet Take by mouth.    [provider]    Family History History reviewed. No pertinent family history.  Social History Social History   Tobacco Use   Smoking status: Never   Smokeless tobacco: Never  Vaping Use   Vaping Use: Never used  Substance Use Topics   Alcohol use: Never   Drug use: Never     Allergies   Azithromycin, Gabapentin, Naproxen, Nortriptyline, Sertraline, Venlafaxine, Penicillin g, and Streptomycin   Review of Systems Review of Systems  Constitutional:  Negative for fever.  Musculoskeletal:  Positive for joint swelling and myalgias. Negative for arthralgias.  Physical Exam Triage Vital Signs ED Triage Vitals  Enc Vitals Group     BP --      Pulse --      Resp --      Temp --      Temp src --      SpO2 --      Weight 06/13/22 0956 165 lb (74.8 kg)     Height 06/13/22 0956 4\' 11"  (1.499 m)     Head Circumference --      Peak Flow --      Pain Score 06/13/22 0955 0     Pain Loc --      Pain Edu? --      Excl. in [place]? --    No data found.  Updated Vital Signs BP (!) 133/50 (BP Location: Left Arm)   Pulse 82   Temp 97.9 F (36.6 C) (Oral)   Resp 17   Ht 4\' 11"  (1.499 m)   Wt 165 lb (74.8 kg)    LMP  (LMP Unknown)   SpO2 97%   BMI 33.33 kg/m   Visual Acuity Right Eye Distance:   Left Eye Distance:   Bilateral Distance:    Right Eye Near:   Left Eye Near:    Bilateral Near:     Physical Exam Vitals and nursing note reviewed.  Constitutional:      Appearance: Normal appearance.  Musculoskeletal:        General: Tenderness present. No swelling, deformity or signs of injury. Normal range of motion.  Skin:    General: Skin is warm and dry.     Capillary Refill: Capillary refill takes less than 2 seconds.     Findings: No bruising or erythema.  Neurological:     Mental Status: She is alert.      UC Treatments / Results  Labs (all labs ordered are listed, but only abnormal results are displayed) Labs Reviewed - No data to display  EKG   Radiology No results found.  Procedures Procedures (including critical care time)  Medications Ordered in UC Medications - No data to display  Initial Impression / Assessment and Plan / UC Course  I have reviewed the triage vital signs and the nursing notes.  Pertinent labs & imaging results that were available during my care of the patient were reviewed by me and considered in my medical decision making (see chart for details).   Patient is a pleasant, nontoxic-appearing 82 year old female presenting for evaluation of an intermittent catch in the anterior aspect of her right thigh.  She also endorses some mild swelling to her right knee though this is the knee that she had a total of joint replacement and and it does swell from time to time.  No redness or warmth to her knee.  She reports that the pain is mostly in the front of her thigh and occurs with change of position and sometimes with walking.  She has been using topical Voltaren gel without significant improvement of symptoms.  On exam patient has no appreciable swelling to her right knee when compared to the left.  There is no tenderness in the knee but there is  tenderness to the anterior quadricep complex.  There is no muscle spasm or muscle tension appreciated.  It is along the path of her incision from her knee replacement.  It is possible that scar tissue formation is causing the pain.  She has an appointment with the [facility] for next week and  I have advised her to keep that.  She should also continue to use the topical Voltaren gel and I will prescribe 10 mg of baclofen that she can use every 8 hours to help calm down muscle irritation and see if that improves her symptoms.   Final Clinical Impressions(s) / UC Diagnoses   Final diagnoses:  Right thigh pain     Discharge Instructions      As we discussed, scar tissue may be what is causing the pain in your right thigh.  Continue to use the topical Voltaren gel 4 times a day as needed.  I would have prescribed you baclofen 10 mg tablets that you can take every 8 hours.  This is to help calm down muscle irritation and it may help your symptoms.  Keep your appointment with the [facility] for next week as scheduled.     ED Prescriptions   None    PDMP not reviewed this encounter.   Margarette Canada, NP 06/13/22 1034

## 2022-10-06 ENCOUNTER — Ambulatory Visit
Admission: EM | Admit: 2022-10-06 | Discharge: 2022-10-06 | Disposition: A | Discharge status: Home / Self Care | Payer: Non-veteran care | Attending: Urgent Care | Admitting: Urgent Care

## 2022-10-06 ENCOUNTER — Other Ambulatory Visit: Payer: Self-pay

## 2022-10-06 DIAGNOSIS — M5441 Lumbago with sciatica, right side: Secondary | ICD-10-CM | POA: Diagnosis not present

## 2022-10-06 DIAGNOSIS — G8929 Other chronic pain: Secondary | ICD-10-CM | POA: Diagnosis not present

## 2022-10-06 MED ORDER — PREDNISONE 10 MG (21) PO TBPK: ORAL_TABLET | Freq: Every day | ORAL | 0 refills | Status: DC

## 2022-10-06 NOTE — Discharge Instructions (Signed)
Recommend following up with [facility] to continue your treatment plan that you began there recently.

## 2022-10-06 NOTE — ED Provider Notes (Signed)
[facility]    CSN: [id number] Arrival date & time: 10/06/22  0904      History   Chief Complaint Chief Complaint  Patient presents with   Leg Pain   Back Pain    HPI Erica Edwards is a 82 y.o. female.    Leg Pain Associated symptoms: back pain   Back Pain Associated symptoms: leg pain    With complaint of lower back pain, bilateral, with radiation down both legs.  Symptoms x 2 weeks.  Patient not using any medication to relieve her discomfort.  Includes spondylosis of lumbosacral region without radiculopathy.  Review of the patient's chart shows multiple visits over the past 6 months for complaint of leg pain.  Also shows hx of treatment with tramadol and with prednisone taper.  Previously treated at the [facility], she states she no longer goes there because of difficulty in traveling.  She states she has been treated at [facility] [address].  Review of her chart shows an encounter at [facility] on 09/04/2022 for lower back pain and left hip and L LE pain.  They recommended initial conservative management with PT eval and treatment.   Past Medical History:  Diagnosis Date   Atrial fibrillation (HCC)    Depression    Hypertension    Osteoarthritis    Vertigo     Patient Active Problem List   Diagnosis Date Noted   Primary osteoarthritis of left knee 02/21/2022   Spondylosis of lumbosacral region without myelopathy or radiculopathy 02/21/2022    Past Surgical History:  Procedure Laterality Date   ABDOMINAL HYSTERECTOMY      OB History   No obstetric history on file.      Home Medications    Prior to Admission medications   Medication Sig Start Date End Date Taking? Authorizing Provider  acetaminophen (TYLENOL) 325 MG tablet TAKE  BY MOUTH 08/26/21   [provider]  amLODipine (NORVASC) 10 MG tablet TAKE ONE-HALF TABLET BY MOUTH EVERY DAY FOR BLOOD PRESSURE 04/02/21   [provider]  apixaban (ELIQUIS) 5 MG TABS  tablet TAKE ONE TABLET BY MOUTH EVERY 12 HOURS - CAUTION BLOOD THINNER 05/31/20   [provider]  baclofen (LIORESAL) 10 MG tablet Take 1 tablet (10 mg total) by mouth 3 (three) times daily. 06/13/22   Becky Augusta, NP  diclofenac Sodium (VOLTAREN) 1 % GEL APPLY 4 GRAMS TOPICALLY TWO TIMES A DAY AS NEEDED PUT ON KNEES FOR PAIN AS DIRECTED. USE GLOVES TO APPLY. 04/02/21   [provider]  gabapentin (NEURONTIN) 300 MG capsule TAKE TWO CAPSULES BY MOUTH THREE TIMES A DAY FOR NERVE PAIN *MAY CAUSE DROWSINESS* 04/02/21   [provider]  levothyroxine (SYNTHROID) 125 MCG tablet Take 1 tablet (125 mcg total) by mouth daily before breakfast. 01/13/22 02/21/22  Lamptey, Britta Mccreedy, MD  lisinopril (ZESTRIL) 40 MG tablet TAKE ONE-HALF TABLET BY MOUTH EVERY DAY FOR BLOOD PRESSURE 04/02/21   [provider]  traMADol (ULTRAM) 50 MG tablet Take 1-2 tablets (50-100 mg total) by mouth every 12 (twelve) hours as needed. 05/08/22   Katha Cabal, DO  trospium (SANCTURA) 20 MG tablet Take by mouth.    [provider]    Family History History reviewed. No pertinent family history.  Social History Social History   Tobacco Use   Smoking status: Never   Smokeless tobacco: Never  Vaping Use   Vaping status: Never Used  Substance Use Topics   Alcohol use: Never  Drug use: Never     Allergies   Azithromycin, Gabapentin, Naproxen, Nortriptyline, Sertraline, Venlafaxine, Penicillin g, and Streptomycin   Review of Systems Review of Systems  Musculoskeletal:  Positive for back pain.     Physical Exam Triage Vital Signs ED Triage Vitals  Encounter Vitals Group     BP 10/06/22 0934 121/71     Systolic BP Percentile --      Diastolic BP Percentile --      Pulse Rate 10/06/22 0934 75     Resp 10/06/22 0934 19     Temp 10/06/22 0934 97.7 F (36.5 C)     Temp Source 10/06/22 0934 Oral     SpO2 10/06/22 0934 97 %     Weight --      Height --      Head  Circumference --      Peak Flow --      Pain Score 10/06/22 0931 8     Pain Loc --      Pain Education --      Exclude from Growth Chart --    No data found.  Updated Vital Signs BP 121/71 (BP Location: Left Arm)   Pulse 75   Temp 97.7 F (36.5 C) (Oral)   Resp 19   LMP  (LMP Unknown)   SpO2 97%   Visual Acuity Right Eye Distance:   Left Eye Distance:   Bilateral Distance:    Right Eye Near:   Left Eye Near:    Bilateral Near:     Physical Exam   UC Treatments / Results  Labs (all labs ordered are listed, but only abnormal results are displayed) Labs Reviewed - No data to display  EKG   Radiology No results found.  Procedures Procedures (including critical care time)  Medications Ordered in UC Medications - No data to display  Initial Impression / Assessment and Plan / UC Course  I have reviewed the triage vital signs and the nursing notes.  Pertinent labs & imaging results that were available during my care of the patient were reviewed by me and considered in my medical decision making (see chart for details).   Erica Edwards is a 82 y.o. female presenting with R sided lower back pain with sciatica. Patient is afebrile without recent antipyretics, satting well on room air. Overall is well appearing, well hydrated, without respiratory distress. Pulmonary exam is unremarkable.  Lungs CTAB without wheezing, rhonchi, rales. RRR without murmurs, rubs, gallops.  Reviewed relevant chart history.   \Will treat patient with a prednisone taper and refer her back to [facility] for continuation of treatment plan started there.  Counseled patient on potential for adverse effects with medications prescribed/recommended today, ER and return-to-clinic precautions discussed, patient verbalized understanding and agreement with care plan.  Final Clinical Impressions(s) / UC Diagnoses   Final diagnoses:  None   Discharge Instructions   None    ED Prescriptions    None    PDMP not reviewed this encounter.   Charma Igo, [place] 10/06/22 1016

## 2022-10-06 NOTE — ED Triage Notes (Signed)
Pt is here with lower back pain that is going down to both legs, this started 2 weeks ago. Pt has not taken any meds to relieve discomfort.

## 2022-11-12 ENCOUNTER — Ambulatory Visit
Admission: EM | Admit: 2022-11-12 | Discharge: 2022-11-12 | Disposition: A | Discharge status: Home / Self Care | Payer: No Typology Code available for payment source | Attending: Physician Assistant | Admitting: Physician Assistant

## 2022-11-12 DIAGNOSIS — N3000 Acute cystitis without hematuria: Secondary | ICD-10-CM | POA: Diagnosis present

## 2022-11-12 DIAGNOSIS — R300 Dysuria: Secondary | ICD-10-CM

## 2022-11-12 LAB — URINALYSIS, W/ REFLEX TO CULTURE (INFECTION SUSPECTED)
Bilirubin Urine: NEGATIVE
Glucose, UA: NEGATIVE mg/dL
Ketones, ur: NEGATIVE mg/dL
Nitrite: NEGATIVE
Protein, ur: 30 mg/dL — AB
Specific Gravity, Urine: 1.025 (ref 1.005–1.030)
pH: 6 (ref 5.0–8.0)

## 2022-11-12 MED ORDER — SULFAMETHOXAZOLE-TRIMETHOPRIM 800-160 MG PO TABS: 1.0000 | ORAL_TABLET | Freq: Two times a day (BID) | ORAL | 0 refills | Status: AC

## 2022-11-12 NOTE — Discharge Instructions (Addendum)

## 2022-11-12 NOTE — ED Triage Notes (Signed)
Sx started 3 days ago. Pain while peeing and pressure.

## 2022-11-12 NOTE — ED Provider Notes (Signed)
[facility]    CSN: [id number] Arrival date & time: 11/12/22  0955      History   Chief Complaint Chief Complaint  Patient presents with   Dysuria    HPI TERRIN GAIR is a 82 y.o. female presenting for 3 day history of dysuria, urgency, frequency, and suprapubic pressure. Denies fever, fatigue, nausea/vomiting, abdominal pain, flank pain, hematuria, vaginal discharge. Patient believes she has a UTI.   HPI  Past Medical History:  Diagnosis Date   Atrial fibrillation (HCC)    Depression    Hypertension    Osteoarthritis    Vertigo     Patient Active Problem List   Diagnosis Date Noted   Primary osteoarthritis of left knee 02/21/2022   Spondylosis of lumbosacral region without myelopathy or radiculopathy 02/21/2022    Past Surgical History:  Procedure Laterality Date   ABDOMINAL HYSTERECTOMY      OB History   No obstetric history on file.      Home Medications    Prior to Admission medications   Medication Sig Start Date End Date Taking? Authorizing Provider  acetaminophen (TYLENOL) 325 MG tablet TAKE  BY MOUTH 08/26/21  Yes [provider]  amLODipine (NORVASC) 10 MG tablet TAKE ONE-HALF TABLET BY MOUTH EVERY DAY FOR BLOOD PRESSURE 04/02/21  Yes [provider]  apixaban (ELIQUIS) 5 MG TABS tablet TAKE ONE TABLET BY MOUTH EVERY 12 HOURS - CAUTION BLOOD THINNER 05/31/20  Yes [provider]  baclofen (LIORESAL) 10 MG tablet Take 1 tablet (10 mg total) by mouth 3 (three) times daily. 06/13/22  Yes Becky Augusta, NP  diclofenac Sodium (VOLTAREN) 1 % GEL APPLY 4 GRAMS TOPICALLY TWO TIMES A DAY AS NEEDED PUT ON KNEES FOR PAIN AS DIRECTED. USE GLOVES TO APPLY. 04/02/21  Yes [provider]  gabapentin (NEURONTIN) 300 MG capsule TAKE TWO CAPSULES BY MOUTH THREE TIMES A DAY FOR NERVE PAIN *MAY CAUSE DROWSINESS* 04/02/21  Yes [provider]  lisinopril (ZESTRIL) 40 MG tablet TAKE ONE-HALF TABLET BY MOUTH EVERY DAY FOR  BLOOD PRESSURE 04/02/21  Yes [provider]  sulfamethoxazole-trimethoprim (BACTRIM DS) 800-160 MG tablet Take 1 tablet by mouth 2 (two) times daily for 5 days. 11/12/22 11/17/22 Yes Shirlee Latch, PA-C  traMADol (ULTRAM) 50 MG tablet Take 1-2 tablets (50-100 mg total) by mouth every 12 (twelve) hours as needed. 05/08/22  Yes Brimage, Vondra, DO  trospium (SANCTURA) 20 MG tablet Take by mouth.   Yes [provider]  levothyroxine (SYNTHROID) 125 MCG tablet Take 1 tablet (125 mcg total) by mouth daily before breakfast. 01/13/22 02/21/22  Lamptey, Britta Mccreedy, MD    Family History History reviewed. No pertinent family history.  Social History Social History   Tobacco Use   Smoking status: Never   Smokeless tobacco: Never  Vaping Use   Vaping status: Never Used  Substance Use Topics   Alcohol use: Never   Drug use: Never     Allergies   Azithromycin, Gabapentin, Naproxen, Nortriptyline, Sertraline, Venlafaxine, Penicillin g, and Streptomycin   Review of Systems Review of Systems  Constitutional:  Negative for chills, fatigue and fever.  Gastrointestinal:  Positive for abdominal pain. Negative for diarrhea, nausea and vomiting.  Genitourinary:  Positive for dysuria, frequency and urgency. Negative for decreased urine volume, flank pain, hematuria, pelvic pain, vaginal bleeding, vaginal discharge and vaginal pain.  Musculoskeletal:  Negative for back pain.  Skin:  Negative for rash.     Physical Exam Triage Vital  Signs ED Triage Vitals  Encounter Vitals Group     BP      Systolic BP Percentile      Diastolic BP Percentile      Pulse      Resp      Temp      Temp src      SpO2      Weight      Height      Head Circumference      Peak Flow      Pain Score      Pain Loc      Pain Education      Exclude from Growth Chart    No data found.  Updated Vital Signs BP (!) 161/91 (BP Location: Left Arm)   Pulse 72   Temp 98.1 F (36.7 C) (Oral)   Resp 17    Wt 175 lb (79.4 kg)   LMP  (LMP Unknown)   SpO2 95%   BMI 35.35 kg/m    Physical Exam Vitals and nursing note reviewed.  Constitutional:      General: She is not in acute distress.    Appearance: Normal appearance. She is not ill-appearing or toxic-appearing.  HENT:     Head: Normocephalic and atraumatic.  Eyes:     General: No scleral icterus.       Right eye: No discharge.        Left eye: No discharge.     Conjunctiva/sclera: Conjunctivae normal.  Cardiovascular:     Rate and Rhythm: Normal rate and regular rhythm.     Heart sounds: Normal heart sounds.  Pulmonary:     Effort: Pulmonary effort is normal. No respiratory distress.     Breath sounds: Normal breath sounds.  Abdominal:     Palpations: Abdomen is soft.     Tenderness: There is no abdominal tenderness. There is no right CVA tenderness or left CVA tenderness.  Musculoskeletal:     Cervical back: Neck supple.  Skin:    General: Skin is dry.  Neurological:     General: No focal deficit present.     Mental Status: She is alert. Mental status is at baseline.     Motor: No weakness.     Gait: Gait normal.  Psychiatric:        Mood and Affect: Mood normal.        Behavior: Behavior normal.        Thought Content: Thought content normal.      UC Treatments / Results  Labs (all labs ordered are listed, but only abnormal results are displayed) Labs Reviewed  URINALYSIS, W/ REFLEX TO CULTURE (INFECTION SUSPECTED) - Abnormal; Notable for the following components:      Result Value   APPearance CLOUDY (*)    Hgb urine dipstick TRACE (*)    Protein, ur 30 (*)    Leukocytes,Ua SMALL (*)    Bacteria, UA RARE (*)    All other components within normal limits  URINE CULTURE    EKG   Radiology No results found.  Procedures Procedures (including critical care time)  Medications Ordered in UC Medications - No data to display  Initial Impression / Assessment and Plan / UC Course  I have reviewed the  triage vital signs and the nursing notes.  Pertinent labs & imaging results that were available during my care of the patient were reviewed by me and considered in my medical decision making (see chart for details).  82 y/o female presents for dysuria, frequency and suprapubic pressure x 3 days.   She is afebrile. Overall well appearing. NAD. No TTP of abdomen and negative CVA tenderness.   UA obtained. UA shows cloudy urine, trace hemoglobin, small leuks and bacteria. Urine sent for culture. Consistent for UTI. Treating with Bactrim DS x 5 days. Will amend treatment based on culture if necessary. Reviewed return precautions.   Final Clinical Impressions(s) / UC Diagnoses   Final diagnoses:  Acute cystitis without hematuria  Dysuria     Discharge Instructions      UTI: Based on either symptoms or urinalysis, you may have a urinary tract infection. We will send the urine for culture and call with results in a few days. Begin antibiotics at this time. Your symptoms should be much improved over the next 2-3 days. Increase rest and fluid intake. If for some reason symptoms are worsening or not improving after a couple of days or the urine culture determines the antibiotics you are taking will not treat the infection, the antibiotics may be changed. Return or go to ER for fever, back pain, worsening urinary pain, discharge, increased blood in urine. May take Tylenol or Motrin OTC for pain relief or consider AZO if no contraindications      ED Prescriptions     Medication Sig Dispense Auth. Provider   sulfamethoxazole-trimethoprim (BACTRIM DS) 800-160 MG tablet Take 1 tablet by mouth 2 (two) times daily for 5 days. 10 tablet Gareth Morgan      PDMP not reviewed this encounter.   Shirlee Latch, PA-C 11/12/22 1105

## 2022-11-13 LAB — URINE CULTURE

## 2023-03-01 ENCOUNTER — Encounter: Payer: Self-pay | Admitting: Emergency Medicine

## 2023-03-01 ENCOUNTER — Ambulatory Visit
Admission: EM | Admit: 2023-03-01 | Discharge: 2023-03-01 | Disposition: A | Discharge status: Home / Self Care | Payer: No Typology Code available for payment source | Attending: Physician Assistant | Admitting: Physician Assistant

## 2023-03-01 DIAGNOSIS — N390 Urinary tract infection, site not specified: Secondary | ICD-10-CM

## 2023-03-01 DIAGNOSIS — B379 Candidiasis, unspecified: Secondary | ICD-10-CM

## 2023-03-01 DIAGNOSIS — R300 Dysuria: Secondary | ICD-10-CM

## 2023-03-01 LAB — URINALYSIS, W/ REFLEX TO CULTURE (INFECTION SUSPECTED)
Bilirubin Urine: NEGATIVE
Glucose, UA: NEGATIVE mg/dL
Ketones, ur: NEGATIVE mg/dL
Nitrite: POSITIVE — AB
Protein, ur: 100 mg/dL — AB
RBC / HPF: >50 RBC/hpf (ref 0–5)
Specific Gravity, Urine: 1.025 (ref 1.005–1.030)
WBC, UA: >50 WBC/hpf (ref 0–5)
pH: 6.5 (ref 5.0–8.0)

## 2023-03-01 MED ORDER — SULFAMETHOXAZOLE-TRIMETHOPRIM 800-160 MG PO TABS: 1.0000 | ORAL_TABLET | Freq: Two times a day (BID) | ORAL | 0 refills | Status: AC

## 2023-03-01 MED ORDER — FLUCONAZOLE 150 MG PO TABS: ORAL_TABLET | ORAL | 0 refills | Status: DC

## 2023-03-01 NOTE — ED Triage Notes (Signed)
Patient reports bladder pressure and urinary frequency that started 2 days ago.

## 2023-03-01 NOTE — ED Provider Notes (Addendum)
[facility]    CSN: [id number] Arrival date & time: 03/01/23  0801      History   Chief Complaint Chief Complaint  Patient presents with   Urinary Frequency    HPI Erica Edwards is a 82 y.o. female.   82 year old female, Erica Edwards, presents to urgent care for evaluation of bladder pressure and urinary frequency that started 2 days prior.  Patient denies any nausea ,vomiting, abdominal pain or fevers.  Last UTI was 08/24.   The history is provided by the patient. No language interpreter was used.    Past Medical History:  Diagnosis Date   Atrial fibrillation (HCC)    Depression    Hypertension    Osteoarthritis    Vertigo     Patient Active Problem List   Diagnosis Date Noted   Acute UTI 03/01/2023   Yeast infection 03/01/2023   Dysuria 03/01/2023   Primary osteoarthritis of left knee 02/21/2022   Spondylosis of lumbosacral region without myelopathy or radiculopathy 02/21/2022    Past Surgical History:  Procedure Laterality Date   ABDOMINAL HYSTERECTOMY      OB History   No obstetric history on file.      Home Medications    Prior to Admission medications   Medication Sig Start Date End Date Taking? Authorizing Provider  fluconazole (DIFLUCAN) 150 MG tablet Take 1 tablet po today,repeat days 3 and 6 03/01/23  Yes Santrice Muzio, Para March, NP  sulfamethoxazole-trimethoprim (BACTRIM DS) 800-160 MG tablet Take 1 tablet by mouth 2 (two) times daily for 5 days. 03/01/23 03/06/23 Yes Antavion Bartoszek, Para March, NP  acetaminophen (TYLENOL) 325 MG tablet TAKE  BY MOUTH 08/26/21   [provider]  amLODipine (NORVASC) 10 MG tablet TAKE ONE-HALF TABLET BY MOUTH EVERY DAY FOR BLOOD PRESSURE 04/02/21   [provider]  apixaban (ELIQUIS) 5 MG TABS tablet TAKE ONE TABLET BY MOUTH EVERY 12 HOURS - CAUTION BLOOD THINNER 05/31/20   [provider]  baclofen (LIORESAL) 10 MG tablet Take 1 tablet (10 mg total) by mouth 3 (three) times daily.  06/13/22   Becky Augusta, NP  diclofenac Sodium (VOLTAREN) 1 % GEL APPLY 4 GRAMS TOPICALLY TWO TIMES A DAY AS NEEDED PUT ON KNEES FOR PAIN AS DIRECTED. USE GLOVES TO APPLY. 04/02/21   [provider]  gabapentin (NEURONTIN) 300 MG capsule TAKE TWO CAPSULES BY MOUTH THREE TIMES A DAY FOR NERVE PAIN *MAY CAUSE DROWSINESS* 04/02/21   [provider]  levothyroxine (SYNTHROID) 125 MCG tablet Take 1 tablet (125 mcg total) by mouth daily before breakfast. 01/13/22 02/21/22  Lamptey, Britta Mccreedy, MD  lisinopril (ZESTRIL) 40 MG tablet TAKE ONE-HALF TABLET BY MOUTH EVERY DAY FOR BLOOD PRESSURE 04/02/21   [provider]  traMADol (ULTRAM) 50 MG tablet Take 1-2 tablets (50-100 mg total) by mouth every 12 (twelve) hours as needed. 05/08/22   Katha Cabal, DO  trospium (SANCTURA) 20 MG tablet Take by mouth.    [provider]    Family History History reviewed. No pertinent family history.  Social History Social History   Tobacco Use   Smoking status: Never   Smokeless tobacco: Never  Vaping Use   Vaping status: Never Used  Substance Use Topics   Alcohol use: Never   Drug use: Never     Allergies   Azithromycin, Gabapentin, Naproxen, Nortriptyline, Sertraline, Venlafaxine, Penicillin g, and Streptomycin   Review of Systems Review of Systems  Constitutional:  Negative for fever.  Genitourinary:  Positive  for difficulty urinating, dysuria and frequency.  All other systems reviewed and are negative.    Physical Exam Triage Vital Signs ED Triage Vitals  Encounter Vitals Group     BP 03/01/23 0812 (!) 167/79     Systolic BP Percentile --      Diastolic BP Percentile --      Pulse Rate 03/01/23 0812 92     Resp 03/01/23 0812 15     Temp 03/01/23 0812 98.3 F (36.8 C)     Temp Source 03/01/23 0812 Oral     SpO2 03/01/23 0812 94 %     Weight 03/01/23 0811 175 lb 0.7 oz (79.4 kg)     Height 03/01/23 0811 4\' 11"  (1.499 m)     Head Circumference --       Peak Flow --      Pain Score 03/01/23 0811 0     Pain Loc --      Pain Education --      Exclude from Growth Chart --    No data found.  Updated Vital Signs BP (!) 167/79 (BP Location: Left Arm)   Pulse 92   Temp 98.3 F (36.8 C) (Oral)   Resp 15   Ht 4\' 11"  (1.499 m)   Wt 175 lb 0.7 oz (79.4 kg)   LMP  (LMP Unknown)   SpO2 94%   BMI 35.35 kg/m   Visual Acuity Right Eye Distance:   Left Eye Distance:   Bilateral Distance:    Right Eye Near:   Left Eye Near:    Bilateral Near:     Physical Exam Vitals and nursing note reviewed.  Constitutional:      General: She is not in acute distress.    Appearance: Normal appearance. She is well-developed and well-groomed.  HENT:     Head: Normocephalic.  Eyes:     Pupils: Pupils are equal, round, and reactive to light.  Cardiovascular:     Rate and Rhythm: Normal rate.  Pulmonary:     Effort: Pulmonary effort is normal.  Abdominal:     General: Bowel sounds are normal.     Tenderness: There is abdominal tenderness in the suprapubic area.  Musculoskeletal:        General: Normal range of motion.     Cervical back: Normal range of motion.  Skin:    General: Skin is warm and dry.  Neurological:     General: No focal deficit present.     Mental Status: She is alert and oriented to person, place, and time.     GCS: GCS eye subscore is 4. GCS verbal subscore is 5. GCS motor subscore is 6.  Psychiatric:        Attention and Perception: Attention normal.        Mood and Affect: Mood normal.        Speech: Speech normal.        Behavior: Behavior normal. Behavior is cooperative.      UC Treatments / Results  Labs (all labs ordered are listed, but only abnormal results are displayed) Labs Reviewed  URINALYSIS, W/ REFLEX TO CULTURE (INFECTION SUSPECTED) - Abnormal; Notable for the following components:      Result Value   APPearance CLOUDY (*)    Hgb urine dipstick LARGE (*)    Protein, ur 100 (*)    Nitrite POSITIVE  (*)    Leukocytes,Ua LARGE (*)    Bacteria, UA MANY (*)    All other  components within normal limits  URINE CULTURE    EKG   Radiology No results found.  Procedures Procedures (including critical care time)  Medications Ordered in UC Medications - No data to display  Initial Impression / Assessment and Plan / UC Course  I have reviewed the triage vital signs and the nursing notes.  Pertinent labs & imaging results that were available during my care of the patient were reviewed by me and considered in my medical decision making (see chart for details).  Clinical Course as of 03/01/23 1610  Wynelle Link Mar 01, 2023  [id number] UA is positive for many bacteria, +nitrate, large leuks, 100 protein, budding yeast, will treat for UTI/yeast infection [JD]    Clinical Course User Index [JD] Panayiotis Rainville, Para March, NP  Discussed exam findings and plan of care with patient, will treat with Bactrim and Diflucan ,strict go to ER precautions given.   Patient verbalized understanding to this provider.  Ddx: Acute UTI, dysuria, yeast infection Final Clinical Impressions(s) / UC Diagnoses   Final diagnoses:  Acute UTI  Yeast infection  Dysuria     Discharge Instructions      You urine is positive for UTI,yeast infection. Take antibiotic/antifungal  as directed. Drink plenty of water, follow up with PCP.   Go to Er for new or worsening issues or concerns(fever, nausea, vomiting, unable to keep meds down, muscle aches, etc)     ED Prescriptions     Medication Sig Dispense Auth. Provider   sulfamethoxazole-trimethoprim (BACTRIM DS) 800-160 MG tablet Take 1 tablet by mouth 2 (two) times daily for 5 days. 10 tablet Jacaria Colburn, NP   fluconazole (DIFLUCAN) 150 MG tablet Take 1 tablet po today,repeat days 3 and 6 3 tablet Duell Holdren, NP      PDMP not reviewed this encounter.   Clancy Gourd, NP 03/01/23 [id number]    Clancy Gourd, NP 03/01/23 [phone]

## 2023-03-01 NOTE — Discharge Instructions (Addendum)
You urine is positive for UTI,yeast infection. Take antibiotic/antifungal  as directed. Drink plenty of water, follow up with PCP.   Go to Er for new or worsening issues or concerns(fever, nausea, vomiting, unable to keep meds down, muscle aches, etc)

## 2023-03-03 LAB — URINE CULTURE: Culture: >=100000 — AB

## 2023-03-25 ENCOUNTER — Ambulatory Visit
Admission: EM | Admit: 2023-03-25 | Discharge: 2023-03-25 | Disposition: A | Discharge status: Home / Self Care | Payer: No Typology Code available for payment source | Attending: Family Medicine | Admitting: Family Medicine

## 2023-03-25 DIAGNOSIS — M5442 Lumbago with sciatica, left side: Secondary | ICD-10-CM

## 2023-03-25 DIAGNOSIS — M5441 Lumbago with sciatica, right side: Secondary | ICD-10-CM | POA: Diagnosis not present

## 2023-03-25 DIAGNOSIS — M25562 Pain in left knee: Secondary | ICD-10-CM | POA: Diagnosis not present

## 2023-03-25 DIAGNOSIS — M25561 Pain in right knee: Secondary | ICD-10-CM | POA: Diagnosis not present

## 2023-03-25 DIAGNOSIS — G8929 Other chronic pain: Secondary | ICD-10-CM

## 2023-03-25 MED ORDER — PREDNISONE 50 MG PO TABS: 50.0000 mg | ORAL_TABLET | Freq: Every day | ORAL | 0 refills | Status: AC

## 2023-03-25 MED ORDER — TRAMADOL HCL 50 MG PO TABS: 50.0000 mg | ORAL_TABLET | Freq: Two times a day (BID) | ORAL | 0 refills | Status: AC | PRN

## 2023-03-25 NOTE — ED Triage Notes (Signed)
 Pt reports pain in her legs when standing, pt states her knee feel weak like they will buckle when she walks x 2 weeks. Pt states this has been going on for a while but got worse in the past 2 weeks. Taking gabapentin with no relief of pain.

## 2023-03-25 NOTE — ED Provider Notes (Signed)
 [facility]    CSN: [id number] Arrival date & time: 03/25/23  0900      History   Chief Complaint Chief Complaint  Patient presents with   Leg Pain    HPI  HPI Erica Edwards is a 83 y.o. female.   Erica Edwards presents for bilateral leg and lower back pain that has worsened in the past 2 weeks.  She has been taking her Gabapentin for her pain but it is not working. Her left knee buckles with she walks and her right knee pain. It hurts to even move her leg.  Uses a cane to get around.  Has had shots in her knees and her back but they didn't do me any good.  She is due to see [facility] next week.        Past Medical History:  Diagnosis Date   Atrial fibrillation (HCC)    Depression    Hypertension    Osteoarthritis    Vertigo     Patient Active Problem List   Diagnosis Date Noted   Acute UTI 03/01/2023   Yeast infection 03/01/2023   Dysuria 03/01/2023   Primary osteoarthritis of left knee 02/21/2022   Spondylosis of lumbosacral region without myelopathy or radiculopathy 02/21/2022    Past Surgical History:  Procedure Laterality Date   ABDOMINAL HYSTERECTOMY      OB History   No obstetric history on file.      Home Medications    Prior to Admission medications   Medication Sig Start Date End Date Taking? Authorizing Provider  acetaminophen (TYLENOL) 325 MG tablet TAKE  BY MOUTH 08/26/21  Yes [provider]  amLODipine (NORVASC) 10 MG tablet TAKE ONE-HALF TABLET BY MOUTH EVERY DAY FOR BLOOD PRESSURE 04/02/21  Yes [provider]  apixaban (ELIQUIS) 5 MG TABS tablet TAKE ONE TABLET BY MOUTH EVERY 12 HOURS - CAUTION BLOOD THINNER 05/31/20  Yes [provider]  diclofenac Sodium (VOLTAREN) 1 % GEL APPLY 4 GRAMS TOPICALLY TWO TIMES A DAY AS NEEDED PUT ON KNEES FOR PAIN AS DIRECTED. USE GLOVES TO APPLY. 04/02/21  Yes [provider]  gabapentin (NEURONTIN) 300 MG capsule TAKE TWO CAPSULES BY MOUTH THREE TIMES A DAY  FOR NERVE PAIN *MAY CAUSE DROWSINESS* 04/02/21  Yes [provider]  levothyroxine  (SYNTHROID ) 125 MCG tablet Take 1 tablet (125 mcg total) by mouth daily before breakfast. 01/13/22 03/25/23 Yes Lamptey, Aleene KIDD, MD  lisinopril (ZESTRIL) 40 MG tablet TAKE ONE-HALF TABLET BY MOUTH EVERY DAY FOR BLOOD PRESSURE 04/02/21  Yes [provider]  predniSONE  (DELTASONE ) 50 MG tablet Take 1 tablet (50 mg total) by mouth daily for 5 days. 03/25/23 03/30/23 Yes Shakia Sebastiano, DO  trospium (SANCTURA) 20 MG tablet Take by mouth.   Yes [provider]  baclofen  (LIORESAL ) 10 MG tablet Take 1 tablet (10 mg total) by mouth 3 (three) times daily. 06/13/22   Bernardino Ditch, NP  fluconazole  (DIFLUCAN ) 150 MG tablet Take 1 tablet po today,repeat days 3 and 6 03/01/23   Defelice, Jeanette, NP  traMADol  (ULTRAM ) 50 MG tablet Take 1-2 tablets (50-100 mg total) by mouth every 12 (twelve) hours as needed. No more than 4 tablets a day 03/25/23   Samirah Scarpati, DO    Family History History reviewed. No pertinent family history.  Social History Social History   Tobacco Use   Smoking status: Never   Smokeless tobacco: Never  Vaping Use   Vaping status: Never Used  Substance Use Topics  Alcohol use: Never   Drug use: Never     Allergies   Azithromycin, Naproxen, Nortriptyline, Sertraline, Venlafaxine, Penicillin g, and Streptomycin   Review of Systems Review of Systems: :negative unless otherwise stated in HPI.      Physical Exam Triage Vital Signs ED Triage Vitals  Encounter Vitals Group     BP 03/25/23 0934 117/68     Systolic BP Percentile --      Diastolic BP Percentile --      Pulse Rate 03/25/23 0934 80     Resp 03/25/23 0934 16     Temp 03/25/23 0934 97.8 F (36.6 C)     Temp Source 03/25/23 0934 Oral     SpO2 03/25/23 0934 96 %     Weight --      Height --      Head Circumference --      Peak Flow --      Pain Score 03/25/23 0939 8     Pain Loc --      Pain  Education --      Exclude from Growth Chart --    No data found.  Updated Vital Signs BP 117/68 (BP Location: Left Arm)   Pulse 80   Temp 97.8 F (36.6 C) (Oral)   Resp 16   LMP  (LMP Unknown)   SpO2 96%   Visual Acuity Right Eye Distance:   Left Eye Distance:   Bilateral Distance:    Right Eye Near:   Left Eye Near:    Bilateral Near:     Physical Exam GEN: chronically ill-appearing elderly female in no acute distress  CVS: well perfused, regular rate RESP: speaking in full sentences without pause, no respiratory distress  MSK:  Bilateral Knee Exam -Inspection: no deformity, no discoloration -Palpation: medial joint line tenderness, no effusion -ROM: Decreased right > left knee active due to pain -Special Tests: Varus Stress: Negative; Valgus Stress: Negative; Anterior drawer: Negative; Posterior drawer: Negative, meniscus testing not attempted due to acute pain.  -Limb neurovascularly intact, no instability noted  Lumbar spine: - Inspection: no new gross deformity or asymmetry, swelling or ecchymosis. No skin changes - Palpation: + TTP over the spinous processes and paraspinal muscles - ROM: limited active due to pain - Strength: 5/5 strength of lower extremity in L4-S1 nerve root distributions b/l - Neuro: sensation intact in the L4-S1 nerve root distribution  - Special testing: positive bilateral straight leg raise    UC Treatments / Results  Labs (all labs ordered are listed, but only abnormal results are displayed) Labs Reviewed - No data to display  EKG   Radiology No results found.   Procedures Procedures (including critical care time)  Medications Ordered in UC Medications - No data to display  Initial Impression / Assessment and Plan / UC Course  I have reviewed the triage vital signs and the nursing notes.  Pertinent labs & imaging results that were available during my care of the patient were reviewed by me and considered in my medical  decision making (see chart for details).      Pt is a 83 y.o.  female with history of OA who present for acute on chronic left and right knee pain along with lumbar back pain for the past 2 weeks.     On chart review, Erica Edwards was previously taking Cymbalta  believe this did not help so she is not taking it.  She was last seen by [facility] FNP Mliss Ravens on 01/15/2023.  Notes to have an upcoming appointment with her soon.   Imaging deferred today.  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     Patient to gradually return to normal activities, as tolerated and continue ordinary activities within the limits permitted by pain. Prescribed steroids and tramadol  for pain relief as these have helped her in the past.  Tylenol PRN. Counseled patient on red flag symptoms and when to seek immediate care.    Patient to follow up with orthopedic provider as scheduled..  Return and ED precautions given. Understanding voiced. Discussed MDM, treatment plan and plan for follow-up with patient who agrees with plan.     Final Clinical Impressions(s) / UC Diagnoses   Final diagnoses:  Bilateral chronic knee pain  Chronic bilateral low back pain with bilateral sciatica     Discharge Instructions      If medication was prescribed, stop by the pharmacy to pick up your prescriptions.  For your  pain, Take 1000 mg Tylenol  three times a day, tramadol  as needed for pain. Take prednisone  as prescribed.  Continue taking your gabapentin.  Rest and elevate the affected painful area.  Apply warm compresses intermittently, as needed.  As pain recedes, begin normal activities slowly as tolerated.  Follow up with [facility] as scheduled.  Watch for worsening symptoms such as an increasing weakness or loss of sensation, increasing pain and/or the loss of bladder or bowel function. Should any of these occur, go to the emergency department immediately.         ED Prescriptions     Medication Sig Dispense Auth. Provider   traMADol  (ULTRAM ) 50 MG tablet Take 1-2 tablets (50-100 mg total) by mouth every 12 (twelve) hours as needed. No more than 4 tablets a day 12 tablet Nelda Luckey, DO   predniSONE  (DELTASONE ) 50 MG tablet Take 1 tablet (50 mg total) by mouth daily for 5 days. 5 tablet Beauden Tremont, DO      I have reviewed the PDMP during this encounter.   Surie Suchocki, DO 03/25/23 1555

## 2023-03-25 NOTE — Discharge Instructions (Addendum)
 If medication was prescribed, stop by the pharmacy to pick up your prescriptions.  For your  pain, Take 1000 mg Tylenol  three times a day, tramadol  as needed for pain. Take prednisone  as prescribed.  Continue taking your gabapentin.  Rest and elevate the affected painful area.  Apply warm compresses intermittently, as needed.  As pain recedes, begin normal activities slowly as tolerated.  Follow up with [facility] as scheduled.  Watch for worsening symptoms such as an increasing weakness or loss of sensation, increasing pain and/or the loss of bladder or bowel function. Should any of these occur, go to the emergency department immediately.

## 2023-04-28 ENCOUNTER — Ambulatory Visit
Admission: EM | Admit: 2023-04-28 | Discharge: 2023-04-28 | Disposition: A | Discharge status: Home / Self Care | Payer: No Typology Code available for payment source | Attending: Emergency Medicine | Admitting: Emergency Medicine

## 2023-04-28 DIAGNOSIS — R319 Hematuria, unspecified: Secondary | ICD-10-CM | POA: Insufficient documentation

## 2023-04-28 DIAGNOSIS — N390 Urinary tract infection, site not specified: Secondary | ICD-10-CM | POA: Insufficient documentation

## 2023-04-28 LAB — URINALYSIS, W/ REFLEX TO CULTURE (INFECTION SUSPECTED)
Bilirubin Urine: NEGATIVE
Glucose, UA: NEGATIVE mg/dL
Ketones, ur: NEGATIVE mg/dL
Nitrite: POSITIVE — AB
Specific Gravity, Urine: 1.015 (ref 1.005–1.030)
WBC, UA: >50 WBC/hpf (ref 0–5)
pH: 6 (ref 5.0–8.0)

## 2023-04-28 MED ORDER — SULFAMETHOXAZOLE-TRIMETHOPRIM 800-160 MG PO TABS: 1.0000 | ORAL_TABLET | Freq: Two times a day (BID) | ORAL | 0 refills | Status: AC

## 2023-04-28 NOTE — ED Provider Notes (Signed)
 HPI  SUBJECTIVE:  Erica Edwards is a 83 y.o. female who presents with lower abdominal pressure, dysuria, urinary urgency for the past 4 days.  No frequency, cloudy, odorous urine, hematuria, nausea, vomiting or fevers, body aches, abdominal, back, pelvic pain.  No antipyretic in the past 6 hours.  No antibiotics in the past month.  She has not tried anything for her symptoms.  No alleviating factors.  Symptoms are worse with walking.  She has a past medical history of atrial fibrillation on Eliquis, UTI, hypertension, hypothyroidism and chronic pain.  She reports hives and lip swelling with penicillin.  PCP: At the [facility]  Last urine culture positive for E. coli in December 2024 was sensitive to cephalosporins, Macrobid , Bactrim  indeterminate sensitivity to fluoroquinolones.  Past Medical History:  Diagnosis Date   Atrial fibrillation (HCC)    Depression    Hypertension    Osteoarthritis    Vertigo     Past Surgical History:  Procedure Laterality Date   ABDOMINAL HYSTERECTOMY      History reviewed. No pertinent family history.  Social History   Tobacco Use   Smoking status: Never   Smokeless tobacco: Never  Vaping Use   Vaping status: Never Used  Substance Use Topics   Alcohol use: Never   Drug use: Never    No current facility-administered medications for this encounter.  Current Outpatient Medications:    acetaminophen (TYLENOL) 325 MG tablet, TAKE  BY MOUTH, Disp: , Rfl:    amLODipine (NORVASC) 10 MG tablet, TAKE ONE-HALF TABLET BY MOUTH EVERY DAY FOR BLOOD PRESSURE, Disp: , Rfl:    apixaban (ELIQUIS) 5 MG TABS tablet, TAKE ONE TABLET BY MOUTH EVERY 12 HOURS - CAUTION BLOOD THINNER, Disp: , Rfl:    diclofenac Sodium (VOLTAREN) 1 % GEL, APPLY 4 GRAMS TOPICALLY TWO TIMES A DAY AS NEEDED PUT ON KNEES FOR PAIN AS DIRECTED. USE GLOVES TO APPLY., Disp: , Rfl:    gabapentin (NEURONTIN) 300 MG capsule, TAKE TWO CAPSULES BY MOUTH THREE TIMES A DAY FOR NERVE PAIN *MAY CAUSE  DROWSINESS*, Disp: , Rfl:    levothyroxine  (SYNTHROID ) 125 MCG tablet, Take 1 tablet (125 mcg total) by mouth daily before breakfast., Disp: 30 tablet, Rfl: 0   lisinopril (ZESTRIL) 40 MG tablet, TAKE ONE-HALF TABLET BY MOUTH EVERY DAY FOR BLOOD PRESSURE, Disp: , Rfl:    sulfamethoxazole -trimethoprim  (BACTRIM  DS) 800-160 MG tablet, Take 1 tablet by mouth 2 (two) times daily for 5 days., Disp: 10 tablet, Rfl: 0   trospium (SANCTURA) 20 MG tablet, Take by mouth., Disp: , Rfl:    traMADol  (ULTRAM ) 50 MG tablet, Take 1-2 tablets (50-100 mg total) by mouth every 12 (twelve) hours as needed. No more than 4 tablets a day, Disp: 12 tablet, Rfl: 0  Allergies  Allergen Reactions   Azithromycin Diarrhea and Other (See Comments)    Other Reaction(s): NAUSEA,VOMITING, Diarrhea, Headache   Naproxen Other (See Comments)   Nortriptyline Other (See Comments)   Sertraline Other (See Comments)    Other reaction(s): Weight gain  Other Reaction(s): Dizziness, Weight gain   Venlafaxine     Other reaction(s): Mood swings   Penicillin G Rash   Streptomycin Rash     ROS  As noted in HPI.   Physical Exam  BP 138/72 (BP Location: Left Arm)   Pulse 75   Temp 97.9 F (36.6 C) (Oral)   Resp 16   Ht 4' 11 (1.499 m)   Wt 79.4 kg   LMP  (  LMP Unknown)   SpO2 96%   BMI 35.35 kg/m   Constitutional: Well developed, well nourished, no acute distress Eyes:  EOMI, conjunctiva normal bilaterally HENT: Normocephalic, atraumatic,mucus membranes moist Respiratory: Normal inspiratory effort Cardiovascular: Normal rate GI: nondistended.  Soft.  No suprapubic, flank tenderness. Back: No CVAT skin: No rash, skin intact Musculoskeletal: no deformities Neurologic: Alert & oriented x 3, no focal neuro deficits Psychiatric: Speech and behavior appropriate   ED Course   Medications - No data to display  Orders Placed This Encounter  Procedures   Urine Culture    Standing Status:   Standing    Number of  Occurrences:   1   Urinalysis, w/ Reflex to Culture (Infection Suspected) -Urine, Clean Catch    Standing Status:   Standing    Number of Occurrences:   1    Specimen Source:   Urine, Clean Catch [76]    Release to patient:   Immediate    Results for orders placed or performed during the hospital encounter of 04/28/23 (from the past 24 hours)  Urinalysis, w/ Reflex to Culture (Infection Suspected) -Urine, Clean Catch     Status: Abnormal   Collection Time: 04/28/23  9:50 AM  Result Value Ref Range   Specimen Source URINE, CLEAN CATCH    Color, Urine YELLOW YELLOW   APPearance CLOUDY (A) CLEAR   Specific Gravity, Urine 1.015 1.005 - 1.030   pH 6.0 5.0 - 8.0   Glucose, UA NEGATIVE NEGATIVE mg/dL   Hgb urine dipstick SMALL (A) NEGATIVE   Bilirubin Urine NEGATIVE NEGATIVE   Ketones, ur NEGATIVE NEGATIVE mg/dL   Protein, ur TRACE (A) NEGATIVE mg/dL   Nitrite POSITIVE (A) NEGATIVE   Leukocytes,Ua LARGE (A) NEGATIVE   Squamous Epithelial / HPF 0-5 0 - 5 /HPF   WBC, UA >50 0 - 5 WBC/hpf   RBC / HPF 11-20 0 - 5 RBC/hpf   Bacteria, UA MANY (A) NONE SEEN   No results found.  ED Clinical Impression  1. Urinary tract infection with hematuria, site unspecified      ED Assessment/Plan     Previous labs reviewed.  As noted in HPI.  UA consistent with UTI.  Sending urine off for culture to confirm antibiotic choice.  Patient reports diffuse urticaria and lip swelling with penicillins, but has tolerated Bactrim  in the past without any issues.  States this has worked well for her in the past.  Will send home with Bactrim  for 5 days.  Follow-up with PCP.  ER return precautions given.  Discussed labs,  MDM, treatment plan, and plan for follow-up with patient. Discussed sn/sx that should prompt return to the ED. patient agrees with plan.   Meds ordered this encounter  Medications   sulfamethoxazole -trimethoprim  (BACTRIM  DS) 800-160 MG tablet    Sig: Take 1 tablet by mouth 2 (two) times  daily for 5 days.    Dispense:  10 tablet    Refill:  0      *This clinic note was created using Scientist, clinical (histocompatibility and immunogenetics). Therefore, there may be occasional mistakes despite careful proofreading.  ?    Van Knee, MD 04/29/23 1421

## 2023-04-28 NOTE — Discharge Instructions (Signed)
 I have sent your urine off for culture to make sure that we have you on the right antibiotic.  Finish the Bactrim , even if you feel better.  Drink plenty of extra fluids.  I have decided to not send you home on Pyridium , but I think your symptoms will resolve soon with the antibiotics.  Go to the ER for fevers above 100.4, kidney pain, nausea, vomiting, or for other concerns

## 2023-04-28 NOTE — ED Triage Notes (Signed)
Pt c/o urinary freq,pain & pressure x4 days. Denies any hematuria.

## 2023-04-30 ENCOUNTER — Telehealth (HOSPITAL_COMMUNITY): Payer: Self-pay

## 2023-04-30 LAB — URINE CULTURE: Culture: >=100000 — AB

## 2023-04-30 MED ORDER — CEPHALEXIN 500 MG PO CAPS: 500.0000 mg | ORAL_CAPSULE | Freq: Two times a day (BID) | ORAL | 0 refills | Status: AC

## 2023-04-30 NOTE — Telephone Encounter (Signed)
 Per Nadyne Austin,  PA-C, " Keflex  500 mg BID x 7 days will be fine."   Attempted to reach patient x1. LVM. Rx sent to pharmacy on file.

## 2023-04-30 NOTE — Telephone Encounter (Signed)
 Pt went home on Bactrim . Culture shows resistance.  Pt has PCN allergy.  RN callback protocol meds include Macrobid  (culture resistant) and Keflex .  Please advise. Thank you.

## 2023-05-12 ENCOUNTER — Encounter: Payer: Self-pay | Admitting: Emergency Medicine

## 2023-05-12 ENCOUNTER — Ambulatory Visit
Admission: EM | Admit: 2023-05-12 | Discharge: 2023-05-12 | Disposition: A | Discharge status: Home / Self Care | Payer: No Typology Code available for payment source | Attending: Emergency Medicine | Admitting: Emergency Medicine

## 2023-05-12 DIAGNOSIS — N390 Urinary tract infection, site not specified: Secondary | ICD-10-CM | POA: Diagnosis present

## 2023-05-12 DIAGNOSIS — B9689 Other specified bacterial agents as the cause of diseases classified elsewhere: Secondary | ICD-10-CM | POA: Insufficient documentation

## 2023-05-12 LAB — URINALYSIS, W/ REFLEX TO CULTURE (INFECTION SUSPECTED)
Bilirubin Urine: NEGATIVE
Glucose, UA: NEGATIVE mg/dL
Ketones, ur: NEGATIVE mg/dL
Nitrite: NEGATIVE
Specific Gravity, Urine: 1.015 (ref 1.005–1.030)
WBC, UA: >50 WBC/hpf (ref 0–5)
pH: 5.5 (ref 5.0–8.0)

## 2023-05-12 LAB — WET PREP, GENITAL
Clue Cells Wet Prep HPF POC: NONE SEEN
Sperm: NONE SEEN
Trich, Wet Prep: NONE SEEN
WBC, Wet Prep HPF POC: <10 (ref <10)
Yeast Wet Prep HPF POC: NONE SEEN

## 2023-05-12 LAB — BASIC METABOLIC PANEL
Anion gap: 9 (ref 5–15)
BUN: 13 mg/dL (ref 8–23)
CO2: 25 mmol/L (ref 22–32)
Calcium: 9.1 mg/dL (ref 8.9–10.3)
Chloride: 103 mmol/L (ref 98–111)
Creatinine, Ser: 0.68 mg/dL (ref 0.44–1.00)
GFR, Estimated: >60 mL/min (ref >60)
Glucose, Bld: 113 mg/dL — ABNORMAL HIGH (ref 70–99)
Potassium: 3.6 mmol/L (ref 3.5–5.1)
Sodium: 137 mmol/L (ref 135–145)

## 2023-05-12 MED ORDER — CIPROFLOXACIN HCL 250 MG PO TABS: 250.0000 mg | ORAL_TABLET | Freq: Two times a day (BID) | ORAL | 0 refills | Status: AC

## 2023-05-12 MED ORDER — PHENAZOPYRIDINE HCL 200 MG PO TABS: 200.0000 mg | ORAL_TABLET | Freq: Three times a day (TID) | ORAL | 0 refills | Status: AC | PRN

## 2023-05-12 NOTE — ED Provider Notes (Signed)
HPI  SUBJECTIVE:  Erica Edwards is a 83 y.o. female who presents with persistent lower abdominal with urination, urgency, frequency since 2/4 when she was seen here and found to have a UTI.  She was sent home with Bactrim because she has urticaria and lip swelling with penicillins.  Unfortunately, the urine culture grew out Klebsiella pneumonia sensitive to cephalosporins and Cipro resistant to Bactrim, Macrobid.  We attempted to reach the patient with 3 voicemails and a MyChart message on 2/6, new prescription 500 mg twice a day for 7 days was sent to the pharmacy on record.  Patient did not receive any of these messages.   According to available records reviewed, she has been treated with Bactrim multiple times in the past.  She is also talking about a white pill that made the antibiotic "work better".  All I am able to find is that she is also been treated with Diflucan in the past with  She has a past medical history of atrial fibrillation on Eliquis, UTI, hypertension, hypothyroidism and chronic pain. She reports hives and lip swelling with penicillin. PCP: At the [facility]    Past Medical History:  Diagnosis Date   Atrial fibrillation (HCC)    Depression    Hypertension    Osteoarthritis    Vertigo     Past Surgical History:  Procedure Laterality Date   ABDOMINAL HYSTERECTOMY      History reviewed. No pertinent family history.  Social History   Tobacco Use   Smoking status: Never   Smokeless tobacco: Never  Vaping Use   Vaping status: Never Used  Substance Use Topics   Alcohol use: Never   Drug use: Never    No current facility-administered medications for this encounter.  Current Outpatient Medications:    ciprofloxacin (CIPRO) 250 MG tablet, Take 1 tablet (250 mg total) by mouth every 12 (twelve) hours for 5 days., Disp: 10 tablet, Rfl: 0   phenazopyridine (PYRIDIUM) 200 MG tablet, Take 1 tablet (200 mg total) by mouth 3 (three) times daily as needed for pain., Disp: 6  tablet, Rfl: 0   acetaminophen (TYLENOL) 325 MG tablet, TAKE  BY MOUTH, Disp: , Rfl:    amLODipine (NORVASC) 10 MG tablet, TAKE ONE-HALF TABLET BY MOUTH EVERY DAY FOR BLOOD PRESSURE, Disp: , Rfl:    apixaban (ELIQUIS) 5 MG TABS tablet, TAKE ONE TABLET BY MOUTH EVERY 12 HOURS - CAUTION BLOOD THINNER, Disp: , Rfl:    diclofenac Sodium (VOLTAREN) 1 % GEL, APPLY 4 GRAMS TOPICALLY TWO TIMES A DAY AS NEEDED PUT ON KNEES FOR PAIN AS DIRECTED. USE GLOVES TO APPLY., Disp: , Rfl:    gabapentin (NEURONTIN) 300 MG capsule, TAKE TWO CAPSULES BY MOUTH THREE TIMES A DAY FOR NERVE PAIN *MAY CAUSE DROWSINESS*, Disp: , Rfl:    levothyroxine (SYNTHROID) 125 MCG tablet, Take 1 tablet (125 mcg total) by mouth daily before breakfast., Disp: 30 tablet, Rfl: 0   lisinopril (ZESTRIL) 40 MG tablet, TAKE ONE-HALF TABLET BY MOUTH EVERY DAY FOR BLOOD PRESSURE, Disp: , Rfl:    traMADol (ULTRAM) 50 MG tablet, Take 1-2 tablets (50-100 mg total) by mouth every 12 (twelve) hours as needed. No more than 4 tablets a day, Disp: 12 tablet, Rfl: 0   trospium (SANCTURA) 20 MG tablet, Take by mouth., Disp: , Rfl:   Allergies  Allergen Reactions   Azithromycin Diarrhea and Other (See Comments)    Other Reaction(s): NAUSEA,VOMITING, Diarrhea, Headache   Naproxen Other (See Comments)  Nortriptyline Other (See Comments)   Sertraline Other (See Comments)    Other reaction(s): Weight gain  Other Reaction(s): Dizziness, Weight gain   Venlafaxine     Other reaction(s): Mood swings   Penicillin G Rash   Streptomycin Rash     ROS  As noted in HPI.   Physical Exam  BP 130/84 (BP Location: Left Arm)   Pulse 79   Temp 98.2 F (36.8 C) (Oral)   Resp 18   LMP  (LMP Unknown)   SpO2 97%   Constitutional: Well developed, well nourished, no acute distress Eyes:  EOMI, conjunctiva normal bilaterally HENT: Normocephalic, atraumatic,mucus membranes moist Respiratory: Normal inspiratory effort Cardiovascular: Normal rate GI:  nondistended.  Soft.  No suprapubic, flank tenderness. Back: No CVAT skin: No rash, skin intact Musculoskeletal: no deformities Neurologic: Alert & oriented x 3, no focal neuro deficits Psychiatric: Speech and behavior appropriate   ED Course   Medications - No data to display  Orders Placed This Encounter  Procedures   Wet prep, genital    Standing Status:   Standing    Number of Occurrences:   1   Urine Culture    Standing Status:   Standing    Number of Occurrences:   1   Urinalysis, w/ Reflex to Culture (Infection Suspected) -Urine, Clean Catch    Standing Status:   Standing    Number of Occurrences:   1    Specimen Source:   Urine, Clean Catch [76]   Basic metabolic panel    Standing Status:   Standing    Number of Occurrences:   1    Results for orders placed or performed during the hospital encounter of 05/12/23 (from the past 24 hours)  Urinalysis, w/ Reflex to Culture (Infection Suspected) -Urine, Clean Catch     Status: Abnormal   Collection Time: 05/12/23  9:03 AM  Result Value Ref Range   Specimen Source URINE, CLEAN CATCH    Color, Urine YELLOW YELLOW   APPearance CLOUDY (A) CLEAR   Specific Gravity, Urine 1.015 1.005 - 1.030   pH 5.5 5.0 - 8.0   Glucose, UA NEGATIVE NEGATIVE mg/dL   Hgb urine dipstick TRACE (A) NEGATIVE   Bilirubin Urine NEGATIVE NEGATIVE   Ketones, ur NEGATIVE NEGATIVE mg/dL   Protein, ur TRACE (A) NEGATIVE mg/dL   Nitrite NEGATIVE NEGATIVE   Leukocytes,Ua LARGE (A) NEGATIVE   Squamous Epithelial / HPF 0-5 0 - 5 /HPF   WBC, UA >50 0 - 5 WBC/hpf   RBC / HPF 6-10 0 - 5 RBC/hpf   Bacteria, UA MANY (A) NONE SEEN   WBC Clumps PRESENT   Wet prep, genital     Status: None   Collection Time: 05/12/23  9:03 AM  Result Value Ref Range   Yeast Wet Prep HPF POC NONE SEEN NONE SEEN   Trich, Wet Prep NONE SEEN NONE SEEN   Clue Cells Wet Prep HPF POC NONE SEEN NONE SEEN   WBC, Wet Prep HPF POC <10 <10   Sperm NONE SEEN   Basic metabolic  panel     Status: Abnormal   Collection Time: 05/12/23 10:19 AM  Result Value Ref Range   Sodium 137 135 - 145 mmol/L   Potassium 3.6 3.5 - 5.1 mmol/L   Chloride 103 98 - 111 mmol/L   CO2 25 22 - 32 mmol/L   Glucose, Bld 113 (H) 70 - 99 mg/dL   BUN 13 8 - 23 mg/dL  Creatinine, Ser 0.68 0.44 - 1.00 mg/dL   Calcium 9.1 8.9 - 16.1 mg/dL   GFR, Estimated >09 >60 mL/min   Anion gap 9 5 - 15   No results found.  ED Clinical Impression  1. Urinary tract infection due to Klebsiella species      ED Assessment/Plan   {The patient has been seen in Urgent Care in the last 3 years. :1} Previous records, labs and outside labs reviewed.  As noted in HPI basic metabolic panel since October 2023.  Will check BMP today.  Will contact patient at [phone] if we need to adjust her dose of Cipro.  Wet prep negative for yeast, BV.  UA consistent with UTI with pyuria many bacteria, large leukocytes trace hematuria.  Will send home with Cipro 250 mg twice daily per up-to-date recommendations, but will extend the treatment duration to 5 days due to recent antibiotics.  For  Will also send home with Pyridium for symptomatic relief.  [facility] as she gets frequent UTIs.  Calculated creatinine clearance on labs done from today 76 mL/min.  Do not need to renally dose Cipro or Pyridium.  Discussed labs,  MDM, treatment plan, and plan for follow-up with patient.  patient agrees with plan.   Meds ordered this encounter  Medications   ciprofloxacin (CIPRO) 250 MG tablet    Sig: Take 1 tablet (250 mg total) by mouth every 12 (twelve) hours for 5 days.    Dispense:  10 tablet    Refill:  0   phenazopyridine (PYRIDIUM) 200 MG tablet    Sig: Take 1 tablet (200 mg total) by mouth 3 (three) times daily as needed for pain.    Dispense:  6 tablet    Refill:  0      *This clinic note was created using Scientist, clinical (histocompatibility and immunogenetics).  Therefore, there may be occasional mistakes despite careful proofreading.  ?

## 2023-05-12 NOTE — ED Triage Notes (Addendum)
Patient presents to UC for UTI symptoms since 02/04. States she has continued abdominal pressure. Completed the bactrim medication, no improvement.   Denies fever, chills, dysuria.

## 2023-05-12 NOTE — Discharge Instructions (Signed)
Finish the Cipro, even if you feel better.  The Pyridium will turn your urine orange, but will help with your symptoms.  We will contact you at the number you gave [place] if we need to change your medications.  Make sure you answer phone calls today from unknown numbers.  Follow-up with your primary care provider at the [place].  You may need to be evaluated by urology since this is a frequent issue.

## 2023-05-14 LAB — URINE CULTURE
